# Patient Record
Sex: Female | Born: 1960 | Race: White | Hispanic: No | Marital: Married | Smoking: Never smoker
Health system: Southern US, Community
[De-identification: ages and names within clinical notes are randomized; demographics above are authoritative.]

## PROBLEM LIST (undated history)

## (undated) DIAGNOSIS — I201 Angina pectoris with documented spasm: Secondary | ICD-10-CM

## (undated) DIAGNOSIS — Z95810 Presence of automatic (implantable) cardiac defibrillator: Secondary | ICD-10-CM

## (undated) DIAGNOSIS — Z9889 Other specified postprocedural states: Secondary | ICD-10-CM

## (undated) DIAGNOSIS — K7689 Other specified diseases of liver: Secondary | ICD-10-CM

## (undated) DIAGNOSIS — T82198A Other mechanical complication of other cardiac electronic device, initial encounter: Secondary | ICD-10-CM

## (undated) DIAGNOSIS — I469 Cardiac arrest, cause unspecified: Secondary | ICD-10-CM

## (undated) DIAGNOSIS — I2119 ST elevation (STEMI) myocardial infarction involving other coronary artery of inferior wall: Secondary | ICD-10-CM

## (undated) DIAGNOSIS — R112 Nausea with vomiting, unspecified: Secondary | ICD-10-CM

## (undated) HISTORY — PX: COLONOSCOPY: SHX174

## (undated) HISTORY — DX: Other specified diseases of liver: K76.89

## (undated) HISTORY — DX: Cardiac arrest, cause unspecified: I46.9

## (undated) HISTORY — DX: Other mechanical complication of other cardiac electronic device, initial encounter: T82.198A

## (undated) HISTORY — PX: ENDOMETRIAL ABLATION: SHX621

## (undated) HISTORY — PX: CHOLECYSTECTOMY: SHX55

---

## 1997-06-07 ENCOUNTER — Other Ambulatory Visit: Admission: RE | Admit: 1997-06-07 | Discharge: 1997-06-07 | Payer: Self-pay | Admitting: Gynecology

## 1998-01-29 ENCOUNTER — Inpatient Hospital Stay (HOSPITAL_COMMUNITY): Admission: AD | Admit: 1998-01-29 | Discharge: 1998-01-29 | Payer: Self-pay | Admitting: Obstetrics and Gynecology

## 1998-01-29 ENCOUNTER — Encounter (HOSPITAL_COMMUNITY): Admission: RE | Admit: 1998-01-29 | Discharge: 1998-02-26 | Payer: Self-pay | Admitting: Obstetrics and Gynecology

## 1998-02-25 ENCOUNTER — Inpatient Hospital Stay (HOSPITAL_COMMUNITY): Admission: AD | Admit: 1998-02-25 | Discharge: 1998-03-01 | Payer: Self-pay | Admitting: Obstetrics and Gynecology

## 1998-03-01 ENCOUNTER — Encounter (HOSPITAL_COMMUNITY): Admission: RE | Admit: 1998-03-01 | Discharge: 1998-05-30 | Payer: Self-pay | Admitting: Obstetrics and Gynecology

## 1998-04-11 ENCOUNTER — Other Ambulatory Visit: Admission: RE | Admit: 1998-04-11 | Discharge: 1998-04-11 | Payer: Self-pay | Admitting: Obstetrics and Gynecology

## 1999-10-14 ENCOUNTER — Other Ambulatory Visit: Admission: RE | Admit: 1999-10-14 | Discharge: 1999-10-14 | Payer: Self-pay | Admitting: Obstetrics and Gynecology

## 2000-05-05 ENCOUNTER — Inpatient Hospital Stay (HOSPITAL_COMMUNITY): Admission: AD | Admit: 2000-05-05 | Discharge: 2000-05-08 | Payer: Self-pay | Admitting: Obstetrics and Gynecology

## 2000-05-05 ENCOUNTER — Encounter (INDEPENDENT_AMBULATORY_CARE_PROVIDER_SITE_OTHER): Payer: Self-pay

## 2000-05-09 ENCOUNTER — Encounter: Admission: RE | Admit: 2000-05-09 | Discharge: 2000-06-08 | Payer: Self-pay | Admitting: Obstetrics and Gynecology

## 2000-06-09 ENCOUNTER — Other Ambulatory Visit: Admission: RE | Admit: 2000-06-09 | Discharge: 2000-06-09 | Payer: Self-pay | Admitting: Obstetrics and Gynecology

## 2002-05-18 ENCOUNTER — Other Ambulatory Visit: Admission: RE | Admit: 2002-05-18 | Discharge: 2002-05-18 | Payer: Self-pay | Admitting: Obstetrics and Gynecology

## 2004-02-13 ENCOUNTER — Other Ambulatory Visit: Admission: RE | Admit: 2004-02-13 | Discharge: 2004-02-13 | Payer: Self-pay | Admitting: Obstetrics and Gynecology

## 2007-01-27 ENCOUNTER — Encounter: Admission: RE | Admit: 2007-01-27 | Discharge: 2007-01-27 | Payer: Self-pay | Admitting: Obstetrics and Gynecology

## 2007-08-05 ENCOUNTER — Encounter: Admission: RE | Admit: 2007-08-05 | Discharge: 2007-08-05 | Payer: Self-pay | Admitting: Obstetrics and Gynecology

## 2008-01-09 ENCOUNTER — Encounter: Admission: RE | Admit: 2008-01-09 | Discharge: 2008-01-09 | Payer: Self-pay | Admitting: Obstetrics and Gynecology

## 2008-10-09 ENCOUNTER — Encounter: Admission: RE | Admit: 2008-10-09 | Discharge: 2008-10-09 | Payer: Self-pay | Admitting: Gastroenterology

## 2008-11-06 ENCOUNTER — Encounter: Admission: RE | Admit: 2008-11-06 | Discharge: 2008-11-06 | Payer: Self-pay | Admitting: General Surgery

## 2008-11-07 ENCOUNTER — Ambulatory Visit (HOSPITAL_BASED_OUTPATIENT_CLINIC_OR_DEPARTMENT_OTHER): Admission: RE | Admit: 2008-11-07 | Discharge: 2008-11-07 | Payer: Self-pay | Admitting: General Surgery

## 2009-01-24 ENCOUNTER — Encounter: Admission: RE | Admit: 2009-01-24 | Discharge: 2009-01-24 | Payer: Self-pay | Admitting: Obstetrics and Gynecology

## 2010-01-28 ENCOUNTER — Encounter
Admission: RE | Admit: 2010-01-28 | Discharge: 2010-01-28 | Payer: Self-pay | Source: Home / Self Care | Attending: Obstetrics and Gynecology | Admitting: Obstetrics and Gynecology

## 2010-04-17 LAB — COMPREHENSIVE METABOLIC PANEL
Alkaline Phosphatase: 41 U/L (ref 39–117)
BUN: 14 mg/dL (ref 6–23)
CO2: 28 mEq/L (ref 19–32)
Calcium: 9.5 mg/dL (ref 8.4–10.5)
GFR calc Af Amer: >60 mL/min (ref >60)
GFR calc non Af Amer: >60 mL/min (ref >60)
Glucose, Bld: 66 mg/dL — ABNORMAL LOW (ref 70–99)
Potassium: 3.9 mEq/L (ref 3.5–5.1)
Total Protein: 6.7 g/dL (ref 6.0–8.3)

## 2010-04-17 LAB — CBC
HCT: 40.2 % (ref 36.0–46.0)
Hemoglobin: 13.5 g/dL (ref 12.0–15.0)
Platelets: 188 10*3/uL (ref 150–400)
WBC: 5 10*3/uL (ref 4.0–10.5)

## 2010-04-17 LAB — PROTIME-INR: Prothrombin Time: 12.8 seconds (ref 11.6–15.2)

## 2010-04-17 LAB — DIFFERENTIAL
Basophils Absolute: 0 10*3/uL (ref 0.0–0.1)
Basophils Relative: 0 % (ref 0–1)
Monocytes Relative: 9 % (ref 3–12)
Neutro Abs: 3.5 10*3/uL (ref 1.7–7.7)
Neutrophils Relative %: 71 % (ref 43–77)

## 2010-04-17 LAB — PREGNANCY, URINE: Preg Test, Ur: NEGATIVE

## 2010-04-17 LAB — APTT: aPTT: 29 seconds (ref 24–37)

## 2010-05-30 NOTE — Op Note (Signed)
[facility] [place]  Patient:    Jessica Collier, Jessica Collier                     MRN: [id number] Proc. Date: 05/05/00 Adm. Date:  [id number] Attending:  Trevor Iha                           Operative Report  PREOPERATIVE DIAGNOSIS:       1. Intrauterine pregnancy at 39 weeks.                               2. Previous cesarean section for repeat.                               3. Desires sterility.  POSTOPERATIVE DIAGNOSIS:      1. Intrauterine pregnancy at 39 weeks.                               2. Previous cesarean section for repeat.                               3. Desires sterility.  OPERATION:                    1. Repeat low transverse cesarean section.                               2. Bilateral tubal ligation.  SURGEON:                      Trevor Iha, M.D.  ANESTHESIA:                   Spinal.  ESTIMATED BLOOD LOSS:         800 cc.  INDICATIONS:                  The patient is a 50 year old, G2, P1, at 38 weeks estimated gestational age.  This pregnancy is complicated by advanced maternal age, and she declined amniocentesis.  She desires repeat cesarean section and tubal ligation.  Risks and benefits were discussed at length including 05/998 failure rate of the tubal ligation.  She and her husband gave their informed consent.  See History & Physical for further details.  FINDINGS AT THE TIME OF SURGERY: A viable female infant, Apgars 8/9, pH arterial was 7.33.  The infants weight 7 pounds 14 ounces.  DESCRIPTION OF PROCEDURE:     After adequate analgesia, the patient was placed in the supine position.  She was sterilely prepped and draped.  The bladder was sterilely drained with a Foley catheter.  The previous Pfannenstiel skin incision was was sharply excised in an elliptical incision.  The incision was taken down sharply to the fascia which was incised transversely superiorly and inferiorly out to the bellies of the rectus muscle.  The  rectus muscle was then separated sharply in midline.  Peritoneum was entered sharply.  Bladder blade was placed.  Uterine serosa was elevated and nicked transversely. Bladder flap was created and replaced by the bladder blade.  A low segment myotomy incision was made down to  the infants vertex and extended laterally with operators fingertips.  The infants vertex was then delivered. The nares and pharynx were suctioned.  The infant was delivered.  The cord was clamped, and infant was handed to the pediatricians.  Good cry was noted.  Viable female infant.  Apgars were 8/9.  Cord blood was obtained, placenta extracted manually.  The uterus was exteriorized, wiped clean with a dry lap.  The myotomy incision was closed in two layers, the first being a running locking of 0 Monocryl, the second being an imbricating layer with good a  Good approximation and good hemostasis.  The right and left fallopian tubes were identified, grasped with Babcock clamps.  Avascular windows in mesosalpinx were noted, sutured with 0 plain, was passed through avascular windows, doubly ligated on each fallopian tube. A 2 cm portion of each fallopian tube was sharply excised, handed off as a specimen.  A 0 silk was used to ligate the proximal portion of the tubes, and Bovie cautery was used to cauterize the tubal ostia with good hemostasis achieved.  At this time, the uterus was placed back in the peritoneal cavity and, after copious amounts of irrigation and adequate hemostasis was assured, the peritoneum was closed with 0 Monocryl.  Irrigation was once again applied, and, after adequate hemostasis, the fascia was then closed in a single suture of 0 Panacryl.  Again, irrigation was used, and, after adequate hemostasis was assured, the skin staples and Steri-Strips were applied.  The patient tolerated the procedure well and was stable on transfer to the recovery room. Sponge and instrument count was normal x 3.  The  patient received 1 g of Cefotan after delivery of the placenta. DD:  05/05/00 TD:  05/05/00 Job: [id number]

## 2010-05-30 NOTE — H&P (Signed)
[facility]  Patient:    CAMALA, TALWAR                     MRN: [id number] Adm. Date:  [id number] Attending:  Trevor Iha                         History and Physical  HISTORY OF PRESENT ILLNESS:   Ms. Chaudhary is a 50 year old G2, P1, with previous cesarean section for twins.  The current pregnancy has been uncomplicated, with a single gestation and estimated date of confinement of May 11, 2000.  She presents today for repeat cesarean section.  She also has desired sterility and desires tubal ligation.  Group B strep was negative. The patient did decline amniocentesis for advanced maternal age.  PAST MEDICAL HISTORY:         Negative.  PAST SURGICAL HISTORY:        1. Laparoscopy for endometriosis.                               2. Knee surgery.                               3. Cesarean section.  PHYSICAL EXAMINATION:  VITAL SIGNS:                  Blood pressure 122/70.  HEART:                        Regular rate and rhythm.  LUNGS:                        Clear to auscultation bilaterally.  ABDOMEN:                      Gravid, nontender.  PELVIC:                       Cervix is closed, thick, and high.  IMPRESSION AND PLAN:          Intrauterine pregnancy at 39 weeks.  Previous cesarean section.  Patient desires repeat cesarean section.  She also desires sterility.  The risks and benefits were discussed at length including ______ , risk of infection, bleeding, damage to uterus, tubes, ovaries, bowel, bladder, or fetus.  Also risk of tubal failure as quoted at 5 out of 1000 failure rate.  The patient gives her informed consent. DD:  05/05/00 TD:  05/05/00 Job: [id number]

## 2010-05-30 NOTE — Discharge Summary (Signed)
[facility]  Patient:    Jessica Collier, Jessica Collier                     MRN: [id number] Adm. Date:  [id number] Disc. Date: [id number] Attending:  Trevor Iha Dictator:   Danie Chandler, R.N.                           Discharge Summary  ADMISSION DIAGNOSES:          1. Intrauterine pregnancy at [redacted] weeks gestation.                               2. Previous cesarean section for repeat.                               3. Desires sterility.  DISCHARGE DIAGNOSES:          1. Intrauterine pregnancy at [redacted] weeks gestation.                               2. Previous cesarean section for repeat.                               3. Desires sterility.  PROCEDURES:                   On May 05, 2000, repeat low transverse cesarean section and bilateral tubal ligation.  REASON FOR ADMISSION:         Please see the dictated H&P.  HOSPITAL COURSE:              The patient was taken to the operating room and underwent the above-named procedure without complication.  This was productive of a viable female infant with Apgars of 8 at one minute and 9 at five minutes and an arterial cord pH of 7.33.  Postoperatively on day #1, the patient had a good return of bowel function and good control of pain.  Her hemoglobin on this day was 10.5, hematocrit 30.8, and white blood cell count 8.9.  On postoperative day #2, she was ambulating well without difficulty and tolerating a regular diet.  She was discharged home on postoperative day #3.  CONDITION ON DISCHARGE:       Good.  DIET:                         Regular as tolerated.  ACTIVITY:                     No heavy lifting, no driving, and no vaginal entry.  FOLLOW-UP:                    She is to follow up in the office in one to two weeks for incision check.  SPECIAL INSTRUCTIONS:         She is to call for temperature greater than 100 degrees, persistent nausea, vomiting, heavy vaginal bleeding, and/or drainage from the  incision site.  DISCHARGE MEDICATIONS:        1. Prenatal vitamins one p.o. q.d.  2. Tylox as directed by M.D.                               3. Motrin 600 mg every six hours as needed for                                  pain. DD:  05/26/00 TD:  05/26/00 Job: [id number]

## 2010-09-19 ENCOUNTER — Other Ambulatory Visit: Payer: Self-pay | Admitting: Obstetrics and Gynecology

## 2011-01-09 ENCOUNTER — Other Ambulatory Visit: Payer: Self-pay | Admitting: Obstetrics and Gynecology

## 2011-01-09 DIAGNOSIS — Z1231 Encounter for screening mammogram for malignant neoplasm of breast: Secondary | ICD-10-CM

## 2011-02-03 ENCOUNTER — Ambulatory Visit: Payer: Self-pay

## 2011-02-11 ENCOUNTER — Ambulatory Visit
Admission: RE | Admit: 2011-02-11 | Discharge: 2011-02-11 | Disposition: A | Discharge status: Home / Self Care | Payer: BC Managed Care – PPO | Source: Ambulatory Visit | Attending: Obstetrics and Gynecology | Admitting: Obstetrics and Gynecology

## 2011-02-11 DIAGNOSIS — Z1231 Encounter for screening mammogram for malignant neoplasm of breast: Secondary | ICD-10-CM

## 2011-12-18 ENCOUNTER — Other Ambulatory Visit: Payer: Self-pay | Admitting: Gastroenterology

## 2011-12-18 DIAGNOSIS — R1013 Epigastric pain: Secondary | ICD-10-CM

## 2011-12-31 ENCOUNTER — Other Ambulatory Visit: Payer: BC Managed Care – PPO

## 2012-01-04 ENCOUNTER — Ambulatory Visit
Admission: RE | Admit: 2012-01-04 | Discharge: 2012-01-04 | Disposition: A | Discharge status: Home / Self Care | Payer: BC Managed Care – PPO | Source: Ambulatory Visit | Attending: Gastroenterology | Admitting: Gastroenterology

## 2012-01-04 DIAGNOSIS — R1013 Epigastric pain: Secondary | ICD-10-CM

## 2012-01-15 ENCOUNTER — Other Ambulatory Visit: Payer: Self-pay | Admitting: Obstetrics and Gynecology

## 2012-01-15 DIAGNOSIS — Z1231 Encounter for screening mammogram for malignant neoplasm of breast: Secondary | ICD-10-CM

## 2012-02-16 ENCOUNTER — Ambulatory Visit
Admission: RE | Admit: 2012-02-16 | Discharge: 2012-02-16 | Disposition: A | Discharge status: Home / Self Care | Payer: BC Managed Care – PPO | Source: Ambulatory Visit | Attending: Obstetrics and Gynecology | Admitting: Obstetrics and Gynecology

## 2012-02-16 DIAGNOSIS — Z1231 Encounter for screening mammogram for malignant neoplasm of breast: Secondary | ICD-10-CM

## 2013-03-02 ENCOUNTER — Other Ambulatory Visit: Payer: Self-pay

## 2013-03-02 DIAGNOSIS — Z1231 Encounter for screening mammogram for malignant neoplasm of breast: Secondary | ICD-10-CM

## 2013-03-14 ENCOUNTER — Ambulatory Visit
Admission: RE | Admit: 2013-03-14 | Discharge: 2013-03-14 | Disposition: A | Discharge status: Home / Self Care | Payer: Self-pay | Source: Ambulatory Visit

## 2013-03-14 DIAGNOSIS — Z1231 Encounter for screening mammogram for malignant neoplasm of breast: Secondary | ICD-10-CM

## 2013-06-29 HISTORY — PX: COLONOSCOPY: SHX174

## 2013-06-29 LAB — HM COLONOSCOPY

## 2014-04-13 ENCOUNTER — Other Ambulatory Visit: Payer: Self-pay

## 2014-04-13 DIAGNOSIS — Z1231 Encounter for screening mammogram for malignant neoplasm of breast: Secondary | ICD-10-CM

## 2014-04-23 ENCOUNTER — Ambulatory Visit
Admission: RE | Admit: 2014-04-23 | Discharge: 2014-04-23 | Disposition: A | Discharge status: Home / Self Care | Payer: BC Managed Care – PPO | Source: Ambulatory Visit

## 2014-04-23 DIAGNOSIS — Z1231 Encounter for screening mammogram for malignant neoplasm of breast: Secondary | ICD-10-CM

## 2014-07-31 ENCOUNTER — Inpatient Hospital Stay (HOSPITAL_COMMUNITY)
Admission: EM | Admit: 2014-07-31 | Discharge: 2014-08-04 | DRG: 245 | Disposition: A | Discharge status: Home / Self Care | Payer: BC Managed Care – PPO | Attending: Cardiovascular Disease | Admitting: Cardiovascular Disease

## 2014-07-31 ENCOUNTER — Encounter (HOSPITAL_COMMUNITY)
Admission: EM | Disposition: A | Discharge status: Home / Self Care | Payer: BC Managed Care – PPO | Source: Home / Self Care | Attending: Cardiovascular Disease

## 2014-07-31 ENCOUNTER — Encounter (HOSPITAL_COMMUNITY): Payer: Self-pay | Admitting: *Deleted

## 2014-07-31 ENCOUNTER — Ambulatory Visit (HOSPITAL_COMMUNITY): Admit: 2014-07-31 | Payer: Self-pay | Admitting: Cardiovascular Disease

## 2014-07-31 ENCOUNTER — Emergency Department (HOSPITAL_COMMUNITY): Payer: BC Managed Care – PPO

## 2014-07-31 DIAGNOSIS — K224 Dyskinesia of esophagus: Secondary | ICD-10-CM | POA: Diagnosis present

## 2014-07-31 DIAGNOSIS — I4901 Ventricular fibrillation: Secondary | ICD-10-CM | POA: Diagnosis not present

## 2014-07-31 DIAGNOSIS — Z959 Presence of cardiac and vascular implant and graft, unspecified: Secondary | ICD-10-CM

## 2014-07-31 DIAGNOSIS — I2119 ST elevation (STEMI) myocardial infarction involving other coronary artery of inferior wall: Principal | ICD-10-CM

## 2014-07-31 DIAGNOSIS — R079 Chest pain, unspecified: Secondary | ICD-10-CM | POA: Diagnosis present

## 2014-07-31 DIAGNOSIS — Z8249 Family history of ischemic heart disease and other diseases of the circulatory system: Secondary | ICD-10-CM | POA: Diagnosis not present

## 2014-07-31 DIAGNOSIS — I201 Angina pectoris with documented spasm: Secondary | ICD-10-CM | POA: Diagnosis present

## 2014-07-31 DIAGNOSIS — I462 Cardiac arrest due to underlying cardiac condition: Secondary | ICD-10-CM | POA: Diagnosis present

## 2014-07-31 DIAGNOSIS — I213 ST elevation (STEMI) myocardial infarction of unspecified site: Secondary | ICD-10-CM | POA: Diagnosis not present

## 2014-07-31 DIAGNOSIS — Z9889 Other specified postprocedural states: Secondary | ICD-10-CM

## 2014-07-31 DIAGNOSIS — I739 Peripheral vascular disease, unspecified: Secondary | ICD-10-CM | POA: Diagnosis present

## 2014-07-31 DIAGNOSIS — Z95810 Presence of automatic (implantable) cardiac defibrillator: Secondary | ICD-10-CM

## 2014-07-31 DIAGNOSIS — Z9189 Other specified personal risk factors, not elsewhere classified: Secondary | ICD-10-CM

## 2014-07-31 DIAGNOSIS — I469 Cardiac arrest, cause unspecified: Secondary | ICD-10-CM

## 2014-07-31 HISTORY — DX: Angina pectoris with documented spasm: I20.1

## 2014-07-31 HISTORY — DX: Cardiac arrest, cause unspecified: I46.9

## 2014-07-31 HISTORY — DX: ST elevation (STEMI) myocardial infarction involving other coronary artery of inferior wall: I21.19

## 2014-07-31 HISTORY — DX: Presence of automatic (implantable) cardiac defibrillator: Z95.810

## 2014-07-31 HISTORY — PX: CARDIAC CATHETERIZATION: SHX172

## 2014-07-31 LAB — CBC
HEMATOCRIT: 38.2 % (ref 36.0–46.0)
Hemoglobin: 13.1 g/dL (ref 12.0–15.0)
MCH: 29.2 pg (ref 26.0–34.0)
MCHC: 34.3 g/dL (ref 30.0–36.0)
MCV: 85.1 fL (ref 78.0–100.0)
Platelets: 195 10*3/uL (ref 150–400)
RBC: 4.49 MIL/uL (ref 3.87–5.11)
RDW: 12.3 % (ref 11.5–15.5)
WBC: 5.3 10*3/uL (ref 4.0–10.5)

## 2014-07-31 LAB — PROTIME-INR
INR: 1.07 (ref 0.00–1.49)
Prothrombin Time: 14.1 seconds (ref 11.6–15.2)

## 2014-07-31 LAB — I-STAT TROPONIN, ED: TROPONIN I, POC: 0.01 ng/mL (ref 0.00–0.08)

## 2014-07-31 LAB — BASIC METABOLIC PANEL
Anion gap: 8 (ref 5–15)
BUN: 15 mg/dL (ref 6–20)
CO2: 27 mmol/L (ref 22–32)
Calcium: 9.2 mg/dL (ref 8.9–10.3)
Chloride: 100 mmol/L — ABNORMAL LOW (ref 101–111)
Creatinine, Ser: 0.71 mg/dL (ref 0.44–1.00)
GFR calc Af Amer: >60 mL/min (ref >60)
Glucose, Bld: 113 mg/dL — ABNORMAL HIGH (ref 65–99)
Potassium: 4.1 mmol/L (ref 3.5–5.1)
SODIUM: 135 mmol/L (ref 135–145)

## 2014-07-31 LAB — APTT: APTT: 28 s (ref 24–37)

## 2014-07-31 SURGERY — LEFT HEART CATH AND CORONARY ANGIOGRAPHY
Anesthesia: LOCAL

## 2014-07-31 MED ORDER — NITROGLYCERIN IN D5W 200-5 MCG/ML-% IV SOLN
0.0000 ug/min | INTRAVENOUS | Status: DC
  Administered 2014-08-01: 10 ug/min via INTRAVENOUS
  Filled 2014-07-31: qty 250

## 2014-07-31 MED ORDER — ACETAMINOPHEN 325 MG PO TABS
650.0000 mg | ORAL_TABLET | ORAL | Status: DC | PRN
  Administered 2014-08-01 – 2014-08-04 (×3): 650 mg via ORAL
  Filled 2014-07-31 (×3): qty 2

## 2014-07-31 MED ORDER — ASPIRIN 81 MG PO CHEW: 324.0000 mg | CHEWABLE_TABLET | ORAL | Status: AC

## 2014-07-31 MED ORDER — ONDANSETRON HCL 4 MG/2ML IJ SOLN: 4.0000 mg | Freq: Four times a day (QID) | INTRAMUSCULAR | Status: DC | PRN

## 2014-07-31 MED ORDER — HEPARIN SODIUM (PORCINE) 5000 UNIT/ML IJ SOLN
4000.0000 [IU] | Freq: Once | INTRAMUSCULAR | Status: AC
  Administered 2014-07-31: 4000 [IU] via INTRAVENOUS

## 2014-07-31 MED ORDER — FENTANYL CITRATE (PF) 100 MCG/2ML IJ SOLN
INTRAMUSCULAR | Status: AC
  Filled 2014-07-31: qty 2

## 2014-07-31 MED ORDER — ACETAMINOPHEN 325 MG PO TABS: 650.0000 mg | ORAL_TABLET | ORAL | Status: DC | PRN

## 2014-07-31 MED ORDER — METOPROLOL TARTRATE 12.5 MG HALF TABLET
12.5000 mg | ORAL_TABLET | Freq: Two times a day (BID) | ORAL | Status: DC
  Administered 2014-08-01 (×2): 12.5 mg via ORAL
  Filled 2014-07-31 (×3): qty 1

## 2014-07-31 MED ORDER — FENTANYL CITRATE (PF) 100 MCG/2ML IJ SOLN
INTRAMUSCULAR | Status: DC | PRN
  Administered 2014-07-31: 25 ug via INTRAVENOUS

## 2014-07-31 MED ORDER — ONDANSETRON HCL 4 MG/2ML IJ SOLN
INTRAMUSCULAR | Status: AC
  Administered 2014-07-31: 22:00:00
  Filled 2014-07-31: qty 2

## 2014-07-31 MED ORDER — SODIUM CHLORIDE 0.9 % IJ SOLN: 3.0000 mL | INTRAMUSCULAR | Status: DC | PRN

## 2014-07-31 MED ORDER — HEPARIN SODIUM (PORCINE) 5000 UNIT/ML IJ SOLN: 5000.0000 [IU] | Freq: Three times a day (TID) | INTRAMUSCULAR | Status: DC

## 2014-07-31 MED ORDER — IOHEXOL 350 MG/ML SOLN
INTRAVENOUS | Status: DC | PRN
  Administered 2014-07-31: 60 mL via INTRA_ARTERIAL

## 2014-07-31 MED ORDER — NITROGLYCERIN 0.4 MG SL SUBL: 0.4000 mg | SUBLINGUAL_TABLET | SUBLINGUAL | Status: DC | PRN

## 2014-07-31 MED ORDER — ASPIRIN 300 MG RE SUPP: 300.0000 mg | RECTAL | Status: AC

## 2014-07-31 MED ORDER — HEPARIN (PORCINE) IN NACL 100-0.45 UNIT/ML-% IJ SOLN
850.0000 [IU]/h | INTRAMUSCULAR | Status: DC
  Administered 2014-07-31: 800 [IU]/h via INTRAVENOUS
  Filled 2014-07-31: qty 250

## 2014-07-31 MED ORDER — SODIUM CHLORIDE 0.9 % IV SOLN: INTRAVENOUS | Status: DC

## 2014-07-31 MED ORDER — SODIUM CHLORIDE 0.9 % IV SOLN: 250.0000 mL | INTRAVENOUS | Status: DC | PRN

## 2014-07-31 MED ORDER — ASPIRIN EC 81 MG PO TBEC
81.0000 mg | DELAYED_RELEASE_TABLET | Freq: Every day | ORAL | Status: DC
Start: 2014-08-01 — End: 2014-08-01
  Administered 2014-08-01: 81 mg via ORAL
  Filled 2014-07-31: qty 1

## 2014-07-31 MED ORDER — HEPARIN (PORCINE) IN NACL 2-0.9 UNIT/ML-% IJ SOLN
INTRAMUSCULAR | Status: AC
  Filled 2014-07-31: qty 1500

## 2014-07-31 MED ORDER — ASPIRIN EC 81 MG PO TBEC
81.0000 mg | DELAYED_RELEASE_TABLET | Freq: Every day | ORAL | Status: DC
  Filled 2014-07-31: qty 1

## 2014-07-31 MED ORDER — ONDANSETRON HCL 4 MG/2ML IJ SOLN
INTRAMUSCULAR | Status: AC
  Filled 2014-07-31: qty 2

## 2014-07-31 MED ORDER — ASPIRIN 81 MG PO CHEW
CHEWABLE_TABLET | ORAL | Status: AC
  Administered 2014-07-31: 243 mg
  Filled 2014-07-31: qty 3

## 2014-07-31 MED ORDER — MIDAZOLAM HCL 2 MG/2ML IJ SOLN
INTRAMUSCULAR | Status: DC | PRN
  Administered 2014-07-31: 1 mg via INTRAVENOUS

## 2014-07-31 MED ORDER — ASPIRIN EC 81 MG PO TBEC: 81.0000 mg | DELAYED_RELEASE_TABLET | Freq: Every day | ORAL | Status: DC

## 2014-07-31 MED ORDER — DEXTROSE 5 % IV SOLN
300.0000 mg | INTRAVENOUS | Status: AC | PRN
  Administered 2014-07-31: 300 mg via INTRAVENOUS

## 2014-07-31 MED ORDER — SODIUM CHLORIDE 0.9 % IV SOLN
INTRAVENOUS | Status: DC
  Administered 2014-07-31: via INTRAVENOUS

## 2014-07-31 MED ORDER — HEPARIN SODIUM (PORCINE) 5000 UNIT/ML IJ SOLN
5000.0000 [IU] | Freq: Three times a day (TID) | INTRAMUSCULAR | Status: DC
  Administered 2014-08-01 – 2014-08-04 (×10): 5000 [IU] via SUBCUTANEOUS
  Filled 2014-07-31 (×14): qty 1

## 2014-07-31 MED ORDER — LIDOCAINE HCL (PF) 1 % IJ SOLN
INTRAMUSCULAR | Status: AC
  Filled 2014-07-31: qty 30

## 2014-07-31 MED ORDER — ATORVASTATIN CALCIUM 80 MG PO TABS
80.0000 mg | ORAL_TABLET | Freq: Every day | ORAL | Status: DC
  Administered 2014-08-01 – 2014-08-03 (×3): 80 mg via ORAL
  Filled 2014-07-31 (×4): qty 1

## 2014-07-31 MED ORDER — MIDAZOLAM HCL 2 MG/2ML IJ SOLN
INTRAMUSCULAR | Status: AC
Start: 2014-07-31 — End: 2014-07-31
  Filled 2014-07-31: qty 2

## 2014-07-31 MED ORDER — ONDANSETRON HCL 4 MG/2ML IJ SOLN
INTRAMUSCULAR | Status: DC | PRN
  Administered 2014-07-31: 4 mg via INTRAVENOUS

## 2014-07-31 MED ORDER — SODIUM CHLORIDE 0.9 % IJ SOLN
3.0000 mL | Freq: Two times a day (BID) | INTRAMUSCULAR | Status: DC
  Administered 2014-08-01 (×3): 3 mL via INTRAVENOUS
  Administered 2014-08-02: 6 mL via INTRAVENOUS
  Administered 2014-08-02 – 2014-08-04 (×3): 3 mL via INTRAVENOUS

## 2014-07-31 SURGICAL SUPPLY — 10 items
CATH INFINITI 5FR ANG PIGTAIL (CATHETERS) ×3 IMPLANT
CATH INFINITI 5FR MULTPACK ANG (CATHETERS) ×2 IMPLANT
KIT ENCORE 26 ADVANTAGE (KITS) ×2 IMPLANT
KIT HEART LEFT (KITS) ×3 IMPLANT
PACK CARDIAC CATHETERIZATION (CUSTOM PROCEDURE TRAY) ×3 IMPLANT
SHEATH PINNACLE 6F 10CM (SHEATH) ×2 IMPLANT
SYR MEDRAD MARK V 150ML (SYRINGE) ×3 IMPLANT
TRANSDUCER W/STOPCOCK (MISCELLANEOUS) ×3 IMPLANT
TUBING CIL FLEX 10 FLL-RA (TUBING) ×3 IMPLANT
WIRE EMERALD 3MM-J .035X150CM (WIRE) ×2 IMPLANT

## 2014-07-31 NOTE — Progress Notes (Signed)
Chaplain responded to code stemi. Chaplain introduced herself to pt husband. Chaplain escorted pt husband to cath lab waiting area, informed MD of his location, and provided emotional support and beverages.  Pt husband reported no further needs at this time, but was visably distressed.Pt will have two sisters arriving later this evening. Chaplain reported this to ED registration and requested they be escorted to cath lab waiting as well. Page chaplain as needed.    07/31/14 2200  Clinical Encounter Type  Visited With Family;Health care provider  Visit Type ED;Code;Spiritual support  Referral From Nurse  Spiritual Encounters  Spiritual Needs Emotional  Stress Factors  Family Stress Factors Lack of knowledge;Health changes  Jessica Collier, [name] 07/31/2014 10:32 PM

## 2014-07-31 NOTE — ED Notes (Signed)
Pt sinus rhythm with good pulses.

## 2014-07-31 NOTE — ED Notes (Signed)
Handed EKG to Dr Jeanell Sparrow.

## 2014-07-31 NOTE — Progress Notes (Addendum)
ANTICOAGULATION CONSULT NOTE - Initial Consult  Pharmacy Consult for Heparin Indication: chest pain/ACS  No Known Allergies  Patient Measurements: Height: 5\' 10"  (177.8 cm) Weight: 150 lb (68.04 kg) IBW/kg (Calculated) : 68.5 Heparin Dosing Weight: 68 kg  Vital Signs: Temp: 98 F (36.7 C) (07/19 2121) Temp Source: Oral (07/19 2121) BP: 142/87 mmHg (07/19 2121) Pulse Rate: 71 (07/19 2121)  Labs: No results for input(s): HGB, HCT, PLT, APTT, LABPROT, INR, HEPARINUNFRC, CREATININE, CKTOTAL, CKMB, TROPONINI in the last 72 hours.  CrCl cannot be calculated (Patient has no serum creatinine result on file.).   Medical History: History reviewed. No pertinent past medical history.  Medications:   (Not in a hospital admission) Scheduled:  . aspirin       Infusions:  . amiodarone (NEXTERONE) IV bolus only 150 mg/100 mL 300 mg (07/31/14 2141)    Assessment: 54yo female presents with chest pain. Pharmacy is consulted to dose heparin for ACS/chest pain. Pt had short period of VFib and was shocked and received amio. CBC wnl and Trop neg x1.  Goal of Therapy:  Heparin level 0.3-0.7 units/ml Monitor platelets by anticoagulation protocol: Yes   Plan:  Give 4000 units bolus x 1 Start heparin infusion at 850 units/hr Check anti-Xa level in 6 hours and daily while on heparin Continue to monitor H&H and platelets  Jessica Collier. Jessica Collier, PharmD Clinical Pharmacist Pager [phone] 07/31/2014,9:45 PM

## 2014-07-31 NOTE — Code Documentation (Signed)
Pt awake and talking.   

## 2014-07-31 NOTE — Code Documentation (Signed)
Cardiologist at bedside.  

## 2014-07-31 NOTE — H&P (Signed)
HPI:  54 y/o healthy woman ([employer]) with no past medical history except previous uterine ablation.  Developed sudden onset 10/10 CP this evening while at rest. Pain persisted and came to ER. In ER, ECG with subtle inferior ST elevation (did not meet STEMI criteria). Brought back to ER and developed VF arrest. Had brief CPR with defibrillation x 1 with Carrick. ECG subsequently normalized and CP resolved. Patient then developed nausea and lightheadedness. Treated with ASA 81mg  x 4, heparin, IVF and Zofran. STEMI activated and taken immediately to cath labe   Review of Systems:     Cardiac Review of Systems: {Y] = yes [ ]  = no  Chest Pain [  y  ]  Resting SOB [   ] Exertional SOB  [  ]  Orthopnea [  ]   Pedal Edema [   ]    Palpitations [  ] Syncope  [  ]   Presyncope [   ]  General Review of Systems: [Y] = yes [  ]=no Constitional: recent weight change [  ]; anorexia [  ]; fatigue [  ]; nausea [  ]; night sweats [  ]; fever [  ]; or chills [  ];                                                                     Dental: poor dentition[  ];   Eye : blurred vision [  ]; diplopia [   ]; vision changes [  ];  Amaurosis fugax[  ]; Resp: cough [  ];  wheezing[  ];  hemoptysis[  ]; shortness of breath[  ]; paroxysmal nocturnal dyspnea[  ]; dyspnea on exertion[  ]; or orthopnea[  ];  GI:  gallstones[  ], vomiting[  ];  dysphagia[  ]; melena[  ];  hematochezia [  ]; heartburn[  ];   GU: kidney stones [  ]; hematuria[  ];   dysuria [  ];  nocturia[  ];               Skin: rash [  ], swelling[  ];, hair loss[  ];  peripheral edema[  ];  or itching[  ]; Musculosketetal: myalgias[  ];  joint swelling[  ];  joint erythema[  ];  joint pain[  ];  back pain[  ];  Heme/Lymph: bruising[  ];  bleeding[  ];  anemia[  ];  Neuro: TIA[  ];  headaches[  ];  stroke[  ];  vertigo[  ];  seizures[  ];   paresthesias[  ];  difficulty walking[  ];  Psych:depression[  ]; anxiety[  ];  Endocrine:  diabetes[  ];  thyroid dysfunction[  ];  Other:  History reviewed. No pertinent past medical history.  Prior to Admission medications   Not on File   No meds   No Known Allergies  History   Social History  . Marital Status: Married    Spouse Name: N/A  . Number of Children: N/A  . Years of Education: N/A   Occupational History  . Not on file.   Social History Main Topics  . Smoking status: Never Smoker   . Smokeless tobacco: Never Used  . Alcohol  Use: No  . Drug Use: No  . Sexual Activity: Yes   Other Topics Concern  . Not on file   Social History Narrative  . 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 Non smoker. No drug use.   FAMILY HISTORY:  Mom died from ovarian CA Father with HTN otherwise well No FHX of premature CAD or SCD  PHYSICAL EXAM: Filed Vitals:   07/31/14 2145  BP: 99/65  Pulse: 85  Temp:   Resp: 26   General:  Thin ill-appearing. pale. No respiratory difficulty HEENT: normal Neck: supple. no JVD. Carotids 2+ bilat; no bruits. No lymphadenopathy or thryomegaly appreciated. Cor: PMI nondisplaced. Regular rate & rhythm. No rubs, gallops or murmurs. Lungs: clear Abdomen: soft, nontender, nondistended. No hepatosplenomegaly. No bruits or masses. Good bowel sounds. Extremities: no cyanosis, clubbing, rash, edema Neuro: alert & oriented x 3, cranial nerves grossly intact. moves all 4 extremities w/o difficulty. Affect pleasant.  ECG: SR 69 with subtle inferior ST elevation.  Resolved on f/u ECG.   Results for orders placed or performed during the hospital encounter of 07/31/14 (from the past 24 hour(s))  CBC     Status: None   Collection Time: 07/31/14  9:30 PM  Result Value Ref Range   WBC 5.3 4.0 - 10.5 K/uL   RBC 4.49 3.87 - 5.11 MIL/uL   Hemoglobin 13.1 12.0 - 15.0 g/dL   HCT 38.2 36.0 - 46.0 %   MCV 85.1 78.0 - 100.0 fL   MCH 29.2 26.0 - 34.0 pg   MCHC 34.3 30.0 - 36.0 g/dL   RDW 12.3 11.5 - 15.5 %   Platelets 195 150 - 400 K/uL  I-stat  troponin, ED     Status: None   Collection Time: 07/31/14  9:35 PM  Result Value Ref Range   Troponin i, poc 0.01 0.00 - 0.08 ng/mL   Comment 3           Dg Chest Port 1 View  07/31/2014   CLINICAL DATA:  54 year old female with chest pain  EXAM: PORTABLE CHEST - 1 VIEW  COMPARISON:  None.  FINDINGS: The heart size and mediastinal contours are within normal limits. Both lungs are clear. The visualized skeletal structures are unremarkable. Stimulator wire noted along the thoracic spine.  IMPRESSION: No active disease.   Electronically Signed   By: Anner Crete M.D.   On: 07/31/2014 21:55     ASSESSMENT: 1. Inferior STEMI 2. VF arrest 3. H/o uterine ablation.  PLAN/DISCUSSION:  Presentation worrisome for SCAD in RCA with possible RV involvement. Treat ASA, heparin. BP too low for b-blocker. Emergent cath.   Ahmadou Bolz,MD 10:05 PM

## 2014-07-31 NOTE — ED Notes (Signed)
Pt was in the middle of speaking a sentence, fully CAOx4, warm/pink skin when she suddenly stopped talking and her eyes rolled backwards. Skin color was pale/gray and monitor showed v-fib. I yelled for assistance, checked for pulses (absent) and immediately started compressions.

## 2014-07-31 NOTE — ED Provider Notes (Signed)
CSN: [id number]     Arrival date & time 07/31/14  2112 History   First MD Initiated Contact with Patient 07/31/14 2130     Chief Complaint  Patient presents with  . Chest Pain   L5 caveat secondary to severe illness and need for intervention  (Consider location/radiation/quality/duration/timing/severity/associated sxs/prior Treatment) HPI 54 year old female with chest pain began approximately 45 minutes prior to arrival. She states that she was at rest and she suddenly felt like there was an elephant sitting on her chest. She describes is in the anterior and substernal area. She states that she had an esophageal spasm approximately 5 years ago and does not think that it was like this. She denies any other symptoms such as shortness of breath, radiation, lightheadedness, or diaphoresis. She denies any coronary artery disease risk factors. She states her father had hypertension otherwise normal. History reviewed. No pertinent past medical history. Past Surgical History  Procedure Laterality Date  . Cholecystectomy    . Cesarean section     No family history on file. History  Substance Use Topics  . Smoking status: Never Smoker   . Smokeless tobacco: Never Used  . Alcohol Use: No   OB History    No data available     Review of Systems  All other systems reviewed and are negative.     Allergies  Review of patient's allergies indicates no known allergies.  Home Medications   Prior to Admission medications   Not on File   BP 142/87 mmHg  Pulse 71  Temp(Src) 98 F (36.7 C) (Oral)  Resp 20  Ht 5\' 10"  (1.778 m)  Wt 150 lb (68.04 kg)  BMI 21.52 kg/m2  SpO2 100% Physical Exam  Constitutional: She is oriented to person, place, and time. She appears well-developed and well-nourished.  HENT:  Head: Normocephalic and atraumatic.  Right Ear: External ear normal.  Left Ear: External ear normal.  Nose: Nose normal.  Mouth/Throat: Oropharynx is clear and moist.  Eyes:  Conjunctivae and EOM are normal. Pupils are equal, round, and reactive to light.  Neck: Normal range of motion. Neck supple. No JVD present. No tracheal deviation present. No thyromegaly present.  Cardiovascular: Normal rate, regular rhythm, normal heart sounds and intact distal pulses.   Pulmonary/Chest: Effort normal and breath sounds normal. She has no wheezes.  Abdominal: Soft. Bowel sounds are normal. She exhibits no mass. There is no tenderness. There is no guarding.  Musculoskeletal: Normal range of motion.  Lymphadenopathy:    She has no cervical adenopathy.  Neurological: She is alert and oriented to person, place, and time. She has normal reflexes. No cranial nerve deficit or sensory deficit. Gait normal. GCS eye subscore is 4. GCS verbal subscore is 5. GCS motor subscore is 6.  Reflex Scores:      Bicep reflexes are 2+ on the right side and 2+ on the left side.      Patellar reflexes are 2+ on the right side and 2+ on the left side.  Cranial nerves grossly intact. Patient fluent.   Skin: Skin is warm and dry.  Psychiatric: She has a normal mood and affect. Her behavior is normal. Judgment and thought content normal.  Nursing note and vitals reviewed.   ED Course  Procedures (including critical care time) Labs Review Labs Reviewed  BASIC METABOLIC PANEL  CBC    Imaging Review No results found.   EKG Interpretation   Date/Time:  Tuesday July 31 2014 21:17:16 EDT Ventricular Rate:  66 PR Interval:  170 QRS Duration: 76 QT Interval:  394 QTC Calculation: 422 R Axis:   90 Text Interpretation:  Critical Test Result: STEM AGE AND GENDER  SPECIFIC ECG ANALYSIS  Normal sinus rhythm Rightward axis  ACUTE  MI / STEMI  Consider right ventricular involvement in acute inferior  infarct Abnormal ECG Confirmed by Cardelia Sassano MD, Andee Poles ([id number]) on 07/31/2014  9:24:20 PM      MDM   Final diagnoses:  ST elevation myocardial infarction (STEMI), unspecified artery    EKG  brought to me from triage. Compared to old EKG she does appear to have some ST elevation in 23 and aVF. Patient placed in room 33 and ST elevation MI orders being placed. Patient was paced out as ST elevation MI. Dr. Claiborne Billings responded. Patient became unresponsive and on my return to the room was in ventricular fibrillation and CPR was ensuing. Defibrillated with 100 J and had return of normal sinus rhythm. Amiodarone IV dosed. Heparin dosed. Remainder of aspirin given by mouth. She is awake and alert and speaking. Husband is at bedside and has been informed of what has been done. Patient is going to Cath Lab.  CRITICAL CARE Performed by: Shaune Pollack Total critical care time: 30 Critical care time was exclusive of separately billable procedures and treating other patients. Critical care was necessary to treat or prevent imminent or life-threatening deterioration. Critical care was time spent personally by me on the following activities: development of treatment plan with patient and/or surrogate as well as nursing, discussions with consultants, evaluation of patient's response to treatment, examination of patient, obtaining history from patient or surrogate, ordering and performing treatments and interventions, ordering and review of laboratory studies, ordering and review of radiographic studies, pulse oximetry and re-evaluation of patient's condition.   Pattricia Boss, MD 07/31/14 2155

## 2014-07-31 NOTE — Code Documentation (Signed)
Pt denies pain at this time

## 2014-07-31 NOTE — Code Documentation (Signed)
Heparin 4000 u administered

## 2014-07-31 NOTE — ED Notes (Signed)
zofran administered 

## 2014-07-31 NOTE — ED Notes (Signed)
Pt left ER with Dr. Missy Sabins and this RN.

## 2014-07-31 NOTE — Progress Notes (Signed)
eLink Physician-Brief Progress Note Patient Name: CLAUDE SWENDSEN DOB: 1960/05/29 MRN: [id number]   Date of Service  07/31/2014   HPI/Events of Note  54 yo female with PMH of esophageal spasm. Present with inferior wall STEMI complicated by VFIB arrest. Quick ROSC. Cardiac Cath lab >> clean coronaries.  Question of RCA Spasm. Current medical management includes Lipator, ASA, Lopressor, Heparin IV infusion and Nitroglycerin IV infusion. Management per Cardiology.  eICU Interventions  Continue current management.      Intervention Category Evaluation Type: New Patient Evaluation  Lysle Dingwall 07/31/2014, 11:38 PM

## 2014-07-31 NOTE — ED Notes (Signed)
Patient presents stating she felt like there was an elephant sitting on her chest.  States it isn't as bad as it was at this time.  Took 81mg  ASA

## 2014-08-01 ENCOUNTER — Inpatient Hospital Stay (HOSPITAL_COMMUNITY): Payer: BC Managed Care – PPO

## 2014-08-01 ENCOUNTER — Encounter (HOSPITAL_COMMUNITY): Payer: Self-pay | Admitting: Cardiovascular Disease

## 2014-08-01 DIAGNOSIS — Z9889 Other specified postprocedural states: Secondary | ICD-10-CM

## 2014-08-01 DIAGNOSIS — I201 Angina pectoris with documented spasm: Secondary | ICD-10-CM

## 2014-08-01 DIAGNOSIS — I2119 ST elevation (STEMI) myocardial infarction involving other coronary artery of inferior wall: Principal | ICD-10-CM

## 2014-08-01 DIAGNOSIS — I4901 Ventricular fibrillation: Secondary | ICD-10-CM

## 2014-08-01 HISTORY — DX: Angina pectoris with documented spasm: I20.1

## 2014-08-01 HISTORY — PX: OTHER SURGICAL HISTORY: SHX169

## 2014-08-01 HISTORY — PX: TRANSTHORACIC ECHOCARDIOGRAM: SHX275

## 2014-08-01 LAB — CBC
HEMATOCRIT: 33 % — AB (ref 36.0–46.0)
HEMOGLOBIN: 11.3 g/dL — AB (ref 12.0–15.0)
MCH: 28.8 pg (ref 26.0–34.0)
MCHC: 34.2 g/dL (ref 30.0–36.0)
MCV: 84.2 fL (ref 78.0–100.0)
Platelets: 152 10*3/uL (ref 150–400)
RBC: 3.92 MIL/uL (ref 3.87–5.11)
RDW: 12.4 % (ref 11.5–15.5)
WBC: 9.2 10*3/uL (ref 4.0–10.5)

## 2014-08-01 LAB — LIPID PANEL
CHOL/HDL RATIO: 2.2 ratio
Cholesterol: 159 mg/dL (ref 0–200)
HDL: 71 mg/dL (ref >40)
LDL CALC: 84 mg/dL (ref 0–99)
Triglycerides: 20 mg/dL (ref <150)
VLDL: 4 mg/dL (ref 0–40)

## 2014-08-01 LAB — HEPATIC FUNCTION PANEL
ALBUMIN: 3.2 g/dL — AB (ref 3.5–5.0)
ALK PHOS: 37 U/L — AB (ref 38–126)
ALT: 45 U/L (ref 14–54)
AST: 43 U/L — ABNORMAL HIGH (ref 15–41)
BILIRUBIN DIRECT: 0.2 mg/dL (ref 0.1–0.5)
BILIRUBIN INDIRECT: 0.5 mg/dL (ref 0.3–0.9)
TOTAL PROTEIN: 5.2 g/dL — AB (ref 6.5–8.1)
Total Bilirubin: 0.7 mg/dL (ref 0.3–1.2)

## 2014-08-01 LAB — MAGNESIUM: Magnesium: 1.8 mg/dL (ref 1.7–2.4)

## 2014-08-01 LAB — BASIC METABOLIC PANEL
Anion gap: 7 (ref 5–15)
BUN: 11 mg/dL (ref 6–20)
CO2: 24 mmol/L (ref 22–32)
Calcium: 8.1 mg/dL — ABNORMAL LOW (ref 8.9–10.3)
Chloride: 104 mmol/L (ref 101–111)
Creatinine, Ser: 0.67 mg/dL (ref 0.44–1.00)
GFR calc Af Amer: >60 mL/min (ref >60)
GFR calc non Af Amer: >60 mL/min (ref >60)
GLUCOSE: 128 mg/dL — AB (ref 65–99)
Potassium: 3.6 mmol/L (ref 3.5–5.1)
Sodium: 135 mmol/L (ref 135–145)

## 2014-08-01 LAB — HEPARIN LEVEL (UNFRACTIONATED): Heparin Unfractionated: <0.1 IU/mL — ABNORMAL LOW (ref 0.30–0.70)

## 2014-08-01 LAB — POCT ACTIVATED CLOTTING TIME: Activated Clotting Time: 134 seconds

## 2014-08-01 LAB — TROPONIN I: Troponin I: 0.77 ng/mL (ref <0.031)

## 2014-08-01 LAB — TSH: TSH: 0.449 u[IU]/mL (ref 0.350–4.500)

## 2014-08-01 LAB — MRSA PCR SCREENING: MRSA by PCR: NEGATIVE

## 2014-08-01 MED ORDER — POTASSIUM CHLORIDE CRYS ER 20 MEQ PO TBCR
40.0000 meq | EXTENDED_RELEASE_TABLET | Freq: Once | ORAL | Status: AC
  Administered 2014-08-01: 40 meq via ORAL
  Filled 2014-08-01: qty 2

## 2014-08-01 MED ORDER — PROMETHAZINE HCL 25 MG/ML IJ SOLN
25.0000 mg | Freq: Four times a day (QID) | INTRAMUSCULAR | Status: DC | PRN
  Administered 2014-08-01: 25 mg via INTRAVENOUS
  Filled 2014-08-01: qty 1

## 2014-08-01 MED ORDER — GADOBENATE DIMEGLUMINE 529 MG/ML IV SOLN
25.0000 mL | Freq: Once | INTRAVENOUS | Status: AC | PRN
  Administered 2014-08-01: 25 mL via INTRAVENOUS

## 2014-08-01 MED ORDER — ISOSORBIDE MONONITRATE ER 30 MG PO TB24
30.0000 mg | ORAL_TABLET | Freq: Every day | ORAL | Status: DC
  Administered 2014-08-01 – 2014-08-04 (×3): 30 mg via ORAL
  Filled 2014-08-01 (×4): qty 1

## 2014-08-01 MED ORDER — DILTIAZEM HCL 30 MG PO TABS
30.0000 mg | ORAL_TABLET | Freq: Three times a day (TID) | ORAL | Status: DC
  Administered 2014-08-01 – 2014-08-02 (×3): 30 mg via ORAL
  Filled 2014-08-01 (×6): qty 1

## 2014-08-01 MED ORDER — MAGNESIUM SULFATE 2 GM/50ML IV SOLN
2.0000 g | Freq: Once | INTRAVENOUS | Status: AC
  Administered 2014-08-01: 2 g via INTRAVENOUS
  Filled 2014-08-01: qty 50

## 2014-08-01 MED ORDER — ZOLPIDEM TARTRATE 5 MG PO TABS
5.0000 mg | ORAL_TABLET | Freq: Every evening | ORAL | Status: DC | PRN
  Administered 2014-08-01 – 2014-08-03 (×3): 5 mg via ORAL
  Filled 2014-08-01 (×3): qty 1

## 2014-08-01 MED FILL — Heparin Sodium (Porcine) 2 Unit/ML in Sodium Chloride 0.9%: INTRAMUSCULAR | Qty: 1500 | Status: AC

## 2014-08-01 MED FILL — Lidocaine HCl Local Preservative Free (PF) Inj 1%: INTRAMUSCULAR | Qty: 30 | Status: AC

## 2014-08-01 NOTE — Progress Notes (Signed)
Subjective: No ectopy No pain  Objective: Vital signs in last 24 hours: Temp:  [97.6 F (36.4 C)-98 F (36.7 C)] 98 F (36.7 C) (07/20 0722) Pulse Rate:  [0-101] 58 (07/20 0800) Resp:  [0-56] 19 (07/20 0800) BP: (86-156)/(43-87) 99/51 mmHg (07/20 0800) SpO2:  [94 %-100 %] 99 % (07/20 0800) Weight:  [148 lb 2.4 oz (67.2 kg)-150 lb (68.04 kg)] 148 lb 2.4 oz (67.2 kg) (07/20 0000) Weight change:  Last BM Date: 07/31/14 Intake/Output from previous day: 07/19 0701 - 07/20 0700 In: 892.9 [I.V.:892.9] Out: 550 [Urine:550] Intake/Output this shift: Total I/O In: 206 [I.V.:206] Out: -   Per Dr. Ellyn Hack PE:  General:Pleasant affect, NAD Skin:Warm and dry, brisk capillary refill HEENT:normocephalic, sclera clear, mucus membranes moist Neck:supple, no JVD, no bruits  Heart:S1S2 nl,  RRR without murmur, gallup, rub or click Lungs:clear without rales, rhonchi, or wheezes PTW:SFKC, non tender, + BS, do not palpate liver spleen or masses Ext:no lower ext edema, 2+ pedal pulses, 2+ radial pulses Neuro:alert and oriented, MAE, follows commands, + facial symmetry Tele: SB  EKG ST elevation resolved now with low voltage.  SB at 4   Lab Results:  Recent Labs  07/31/14 2130 08/01/14 0234  WBC 5.3 9.2  HGB 13.1 11.3*  HCT 38.2 33.0*  PLT 195 152   BMET  Recent Labs  07/31/14 2130 08/01/14 0234  NA 135 135  K 4.1 3.6  CL 100* 104  CO2 27 24  GLUCOSE 113* 128*  BUN 15 11  CREATININE 0.71 0.67  CALCIUM 9.2 8.1*   No results for input(s): TROPONINI in the last 72 hours.  Invalid input(s): CK, MB  Lab Results  Component Value Date   CHOL 159 08/01/2014   HDL 71 08/01/2014   LDLCALC 84 08/01/2014   TRIG 20 08/01/2014   CHOLHDL 2.2 08/01/2014   No results found for: HGBA1C   Lab Results  Component Value Date   TSH 0.449 08/01/2014    Hepatic Function Panel No results for input(s): PROT, ALBUMIN, AST, ALT, ALKPHOS, BILITOT, BILIDIR, IBILI in the  last 72 hours.  Recent Labs  08/01/14 0234  CHOL 159   No results for input(s): PROTIME in the last 72 hours.   Studies/Results: Dg Chest Port 1 View  07/31/2014   CLINICAL DATA:  54 year old female with chest pain  EXAM: PORTABLE CHEST - 1 VIEW  COMPARISON:  None.  FINDINGS: The heart size and mediastinal contours are within normal limits. Both lungs are clear. The visualized skeletal structures are unremarkable. Stimulator wire noted along the thoracic spine.  IMPRESSION: No active disease.   Electronically Signed   By: Anner Crete M.D.   On: 07/31/2014 21:55    Medications: I have reviewed the patient's current medications. Scheduled Meds: . aspirin EC  81 mg Oral Daily  . atorvastatin  80 mg Oral q1800  . heparin  5,000 Units Subcutaneous 3 times per day  . metoprolol tartrate  12.5 mg Oral BID  . sodium chloride  3 mL Intravenous Q12H   Continuous Infusions: . sodium chloride    . sodium chloride 100 mL/hr at 08/01/14 0800  . nitroGLYCERIN 10 mcg/min (08/01/14 0800)   PRN Meds:.sodium chloride, acetaminophen, nitroGLYCERIN, ondansetron (ZOFRAN) IV, promethazine, sodium chloride  Bedside eval of Echo: EF looks to be preserved, no obvious WMA.    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 This evening, while on  home she developed severe substernal chest pressure which occurred at rest. She was brought to the emergency room by her husband and her ECG revealed subtle 1 mm ST elevation inferiorly. The patient suddenly developed a VF arrest and was defibrillated 1 with return of normal rhythm.  Cardiac cath with normal coronary arteries.   Normal EF on cath.     Principal Problem:   ST elevation myocardial infarction (STEMI) of inferior wall, due to SPASM- causing V fib.  Awaiting 2 d echo, Mg+ 1.8, TSH 0.449  Active Problems:   VF (ventricular fibrillation)- shock X 1- no further V fib. 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 She'll be treated with aspirin, low-dose nitrates as BP allows, and low-dose beta blocker therapy. will ask EP to see.    Low voltage on EKG no pre admit EKGs to compare.      LOS: 1 day   Time spent with pt. : 15 minutes. [facility]  Nurse Practitioner Certified Pager [phone] or after 5pm and on weekends call [phone] 08/01/2014, 8:52 AM  I have seen, examined and evaluated the patient this AM along with Cecilie Kicks.  After reviewing all the available data and chart,  I agree with her findings, examination as well as impression recommendations.  Unclear etiology of VF - but with her presenting Sx being Crushing SSCP - ischemic etiology cannot be excluded. ? Coronary spasm / Prinzmetal Angina -> will convert from IV NTG to Imdur, continue low dose BB (unlikely to have BP room for CCB).  Statin & ASA.  Agree with EP consultation - sister has h/o SVT.  Very low voltage on EKG -? Cardiac MRI to evaluate for infiltrative process.  If no compelling reason from EP to keep in ICU - will move to Tele this PM.   Ambulate.   Leonie Man, M.D., M.S. Interventional Cardiologist   Pager # [phone]

## 2014-08-01 NOTE — Progress Notes (Signed)
  Echocardiogram 2D Echocardiogram has been performed.  Jennette Dubin 08/01/2014, 9:47 AM

## 2014-08-01 NOTE — Consult Note (Signed)
ELECTROPHYSIOLOGY CONSULT NOTE    Patient ID: Jessica Collier MRN: [id number], DOB/AGE: Jul 04, 1960 54 y.o.  Admit date: 07/31/2014 Date of Consult: 08/01/2014  Primary Physician: No primary care provider on file. Primary Cardiologist: Bensimhon (new this admission)  Reason for Consultation: VF   HPI:  Jessica Collier is a 54 y.o. female with no significant past medical history.  Yesterday, she developed sudden onset chest pain and presented to the ER for further evaluation.  She developed ST elevation while in the ER with subsequent VF that was quickly resuscitated.  Cardiac catheterization demonstrated normal LV function and normal coronaries with the thought being RCA vasospasm.  EP has been asked to evaluate for treatment options.   She has no family history of sudden death.  She has had prior syncope as a child during a gas leak but none since.  She does have occasional palpitations that are short lived and not associated with other symptoms.  She does not have chest pain with exertion, shortness of breath, LE edema, recent fevers, chills, nausea or vomiting.  Echo is pending.     Past Medical History  Diagnosis Date  . S/P cardiac cath, normal coronary arteries 08/01/2014     Surgical History:  Past Surgical History  Procedure Laterality Date  . Cholecystectomy    . Cesarean section    . Cardiac catheterization N/A 07/31/2014    Procedure: Left Heart Cath and Coronary Angiography;  Surgeon: Troy Sine, MD;  Location: [place] [facility];  Service: Cardiovascular;  Laterality: N/A;     No prescriptions prior to admission    Inpatient Medications:  . aspirin EC  81 mg Oral Daily  . atorvastatin  80 mg Oral q1800  . heparin  5,000 Units Subcutaneous 3 times per day  . metoprolol tartrate  12.5 mg Oral BID  . sodium chloride  3 mL Intravenous Q12H    Allergies: No Known Allergies  History   Social History  . Marital Status: Married    Spouse Name: N/A  . Number  of Children: N/A  . Years of Education: N/A   Occupational History  . Not on file.   Social History Main Topics  . Smoking status: Never Smoker   . Smokeless tobacco: Never Used  . Alcohol Use: No  . Drug Use: No  . Sexual Activity: Yes   Other Topics Concern  . Not on file   Social History Narrative     Family History  Problem Relation Age of Onset  . Hypertension Father     Review of Systems: All other systems reviewed and are otherwise negative except as noted above.  Physical Exam: Filed Vitals:   08/01/14 0600 08/01/14 0700 08/01/14 0722 08/01/14 0800  BP: 94/43 98/49  99/51  Pulse: 46 51  58  Temp:   98 F (36.7 C)   TempSrc:   Oral   Resp: 14 18  19   Height:      Weight:      SpO2: 94% 99%  99%    GEN- The patient is well appearing, alert and oriented x 3 today.   HEENT: normocephalic, atraumatic; sclera clear, conjunctiva pink; hearing intact; oropharynx clear; neck supple  Lungs- Clear to ausculation bilaterally, normal work of breathing.  No wheezes, rales, rhonchi Heart- Regular rate and rhythm, no murmurs, rubs or gallops  GI- soft, non-tender, non-distended, bowel sounds present  Extremities- no clubbing, cyanosis, or edema; DP/PT/radial pulses 2+ bilaterally MS- no significant deformity  or atrophy Skin- warm and dry, no rash or lesion Psych- euthymic mood, full affect Neuro- strength and sensation are intact  Labs:   Lab Results  Component Value Date   WBC 9.2 08/01/2014   HGB 11.3* 08/01/2014   HCT 33.0* 08/01/2014   MCV 84.2 08/01/2014   PLT 152 08/01/2014    Recent Labs Lab 08/01/14 0234  NA 135  K 3.6  CL 104  CO2 24  BUN 11  CREATININE 0.67  CALCIUM 8.1*  GLUCOSE 128*      Radiology/Studies: Dg Chest Port 1 View  07/31/2014   CLINICAL DATA:  54 year old female with chest pain  EXAM: PORTABLE CHEST - 1 VIEW  COMPARISON:  None.  FINDINGS: The heart size and mediastinal contours are within normal limits. Both lungs are clear.  The visualized skeletal structures are unremarkable. Stimulator wire noted along the thoracic spine.  IMPRESSION: No active disease.   Electronically Signed   By: Anner Crete M.D.   On: 07/31/2014 21:55    EKG: sinus rhythm, normal intervals, low voltage QRS, T wave inversions V1-V3  TELEMETRY: sinus rhythm  Assessment/Plan: 1.  VF arrest The patient had a resuscitated VF arrest in the setting of ST elevation. Catheterization demonstrated normal coronaries and normal LV function. Echo is pending this morning. Coronary vasospasm is possible trigger.  There is no family history of sudden death.  For now, with vasospasm as a possible cause, would avoid ASA and use CCB instead of BB. With low voltage QRS, will pursue cardiac MRI (discussed with Dr Meda Coffee today). No driving x6 months Keep K >3.9, Mg >1.8  Dr Caryl Comes to see later today.    Signed, Chanetta Marshall, NP 08/01/2014 9:16 AM  The patient had ST segment elevation with subsequent ventricular fibrillation normalization of ST segment elevation in the context of normal coronary arteries. This strongly speaks to mechanism of coronary artery spasm as the critical event responsible then presumably for the chest pain as well as a subsequent electrical event.  Of additional concern but not clearly related other low voltage in her limb leads, her marfanoid habitus, and her PVCs with a right ventricular outflow tract morphology and her history of tachypalpitations. I am not sure how these would be related to ST elevation presumably for spasm. The latter potentially contribute to an explanation for a electrical event that (strips are not available)  In the context of spasm, beta blockers and aspirin are contraindicated. Statins nitrates and calcium blockers are felt to be the standard although a recent paper raises the question about the nitrates.  Cardiac MR is indicated as is signal-averaged ECG looking for evidence of structural heart disease  which might help [place] in decision-making.  A paper published last week in [facility] raises the issues related to spasm and long-term prognosis. In that paper a significant distinction was identified and prognosis related to spasm in the context of or the absence of coronary artery disease. In the latter group, risks were relatively low.

## 2014-08-01 NOTE — Care Management Note (Signed)
Case Management Note  Patient Details  Name: Jessica Collier MRN: [id number] Date of Birth: Jan 16, 1960  Subjective/Objective:        Adm w stemi            Action/Plan: lives w husband   Expected Discharge Date:                  Expected Discharge Plan:     In-House Referral:     Discharge planning Services     Post Acute Care Choice:    Choice offered to:     DME Arranged:    DME Agency:     HH Arranged:    [place] Agency:     Status of Service:     Medicare Important Message Given:    Date Medicare IM Given:    Medicare IM give by:    Date Additional Medicare IM Given:    Additional Medicare Important Message give by:     If discussed at [place] of Stay Meetings, dates discussed:    Additional Comments: ur review done  Lacretia Leigh, RN 08/01/2014, 8:34 AM

## 2014-08-02 DIAGNOSIS — Z9189 Other specified personal risk factors, not elsewhere classified: Secondary | ICD-10-CM

## 2014-08-02 LAB — BASIC METABOLIC PANEL
ANION GAP: 5 (ref 5–15)
BUN: 9 mg/dL (ref 6–20)
CALCIUM: 8.5 mg/dL — AB (ref 8.9–10.3)
CO2: 25 mmol/L (ref 22–32)
CREATININE: 0.67 mg/dL (ref 0.44–1.00)
Chloride: 111 mmol/L (ref 101–111)
GFR calc Af Amer: >60 mL/min (ref >60)
GFR calc non Af Amer: >60 mL/min (ref >60)
GLUCOSE: 95 mg/dL (ref 65–99)
Potassium: 3.8 mmol/L (ref 3.5–5.1)
Sodium: 141 mmol/L (ref 135–145)

## 2014-08-02 LAB — CBC
HCT: 33.1 % — ABNORMAL LOW (ref 36.0–46.0)
Hemoglobin: 11.1 g/dL — ABNORMAL LOW (ref 12.0–15.0)
MCH: 29 pg (ref 26.0–34.0)
MCHC: 33.5 g/dL (ref 30.0–36.0)
MCV: 86.4 fL (ref 78.0–100.0)
PLATELETS: 143 10*3/uL — AB (ref 150–400)
RBC: 3.83 MIL/uL — ABNORMAL LOW (ref 3.87–5.11)
RDW: 12.7 % (ref 11.5–15.5)
WBC: 5.5 10*3/uL (ref 4.0–10.5)

## 2014-08-02 LAB — MAGNESIUM: MAGNESIUM: 2 mg/dL (ref 1.7–2.4)

## 2014-08-02 MED ORDER — DILTIAZEM HCL ER COATED BEADS 120 MG PO CP24
120.0000 mg | ORAL_CAPSULE | Freq: Every day | ORAL | Status: DC
  Administered 2014-08-02 – 2014-08-03 (×2): 120 mg via ORAL
  Filled 2014-08-02 (×3): qty 1

## 2014-08-02 NOTE — Progress Notes (Signed)
Patient Name: Jessica Collier      SUBJECTIVE withpoud compklaint  Past Medical History  Diagnosis Date  . S/P cardiac cath, normal coronary arteries 08/01/2014    Scheduled Meds:  Scheduled Meds: . atorvastatin  80 mg Oral q1800  . diltiazem  30 mg Oral 3 times per day  . heparin  5,000 Units Subcutaneous 3 times per day  . isosorbide mononitrate  30 mg Oral Daily  . sodium chloride  3 mL Intravenous Q12H   Continuous Infusions: . sodium chloride Stopped (08/01/14 1300)  . sodium chloride Stopped (08/01/14 1100)   sodium chloride, acetaminophen, nitroGLYCERIN, ondansetron (ZOFRAN) IV, promethazine, sodium chloride, zolpidem    PHYSICAL EXAM Filed Vitals:   08/02/14 0800 08/02/14 1021 08/02/14 1149 08/02/14 1152  BP: 113/52 113/56 128/67   Pulse:      Temp: 98.2 F (36.8 C)   97.9 F (36.6 C)  TempSrc: Oral   Oral  Resp:    16  Height:      Weight:      SpO2:    100%  Well developed and nourished in no acute distress HENT normal Neck supple with JVP-flat Clear Regular rate and rhythm, no murmurs or gallops Abd-soft with active BS No Clubbing cyanosis edema Skin-warm and dry A & Oriented  Grossly normal sensory and motor function  TELEMETRY: Reviewed telemetry pt in nsr     Intake/Output Summary (Last 24 hours) at 08/02/14 1347 Last data filed at 08/02/14 1100  Gross per 24 hour  Intake    486 ml  Output      0 ml  Net    486 ml    LABS: Basic Metabolic Panel:  Recent Labs Lab 07/31/14 2130 08/01/14 0234 08/02/14 0229  NA 135 135 141  K 4.1 3.6 3.8  CL 100* 104 111  CO2 27 24 25   GLUCOSE 113* 128* 95  BUN 15 11 9   CREATININE 0.71 0.67 0.67  CALCIUM 9.2 8.1* 8.5*  MG  --  1.8 2.0   Cardiac Enzymes:  Recent Labs  08/01/14 0945  TROPONINI 0.77*   CBC:  Recent Labs Lab 07/31/14 2130 08/01/14 0234 08/02/14 0229  WBC 5.3 9.2 5.5  HGB 13.1 11.3* 11.1*  HCT 38.2 33.0* 33.1*  MCV 85.1 84.2 86.4  PLT 195 152 143*    PROTIME:  Recent Labs  07/31/14 2130  LABPROT 14.1  INR 1.07   Liver Function Tests:  Recent Labs  08/01/14 0945  AST 43*  ALT 45  ALKPHOS 37*  BILITOT 0.7  PROT 5.2*  ALBUMIN 3.2*   No results for input(s): LIPASE, AMYLASE in the last 72 hours. BNP: BNP (last 3 results) No results for input(s): BNP in the last 8760 hours.  ProBNP (last 3 results) No results for input(s): PROBNP in the last 8760 hours.  D-Dimer: No results for input(s): DDIMER in the last 72 hours. Hemoglobin A1C: No results for input(s): HGBA1C in the last 72 hours. Fasting Lipid Panel:  Recent Labs  08/01/14 0234  CHOL 159  HDL 71  LDLCALC 84  TRIG 20  CHOLHDL 2.2   Thyroid Function Tests:  Recent Labs  08/01/14 0234  TSH 0.449   Anemia Panel: No results for input(s): VITAMINB12, FOLATE, FERRITIN, TIBC, IRON, RETICCTPCT in the last 72 hours.      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  First and foremost with Dr. Meda Coffee having reviewed the cardiac MRI. The abnormalities on MRI are not acute. This suggests 2 issues. The first implicates recurring spasm with the ultimate creation of a transmural scar. The second suggests that the acute event of spasm followed by ventricular fibrillation may be an interaction between recurring spasm and now a fixed myocardial infarction scar.  This drives a decision in favor of an ICD as it raises issues of risk of spasm from the low risk: Of no coronary disease to the very high risk: Recently identified in the [facility] article of those with coronary disease. Furthermore, due to her codominant arteries, the breadth  of scar suggests that there may be multivessel spasm as well  I have reviewed these issues with  the family for an hour. We have discussed the relative benefits and merits of transvenous versus subcutaneous ICD implantation.  The advantages of the former including battery longevity, history derived from use in randomized controlled trials and perhaps a somewhat lower rate of inappropriate ICD discharges. Advantages of the latter  include the fact that it is extravascular,  Resulting different implications of device infection and it being non-transvalvular.    They would like to pursue subcutaneous ICD and we will arrange to have her mapped today by [facility] therapy will be continued and we can now add nitrates in addition. We will consolidate the diltiazem. We will continue her on heparin  Signed, Virl Axe MD  08/02/2014

## 2014-08-02 NOTE — Progress Notes (Addendum)
Subjective: No ectopy No pain; Has not yet walked/  Objective: Vital signs in last 24 hours: Temp:  [97.9 F (36.6 C)-98.5 F (36.9 C)] 97.9 F (36.6 C) (07/21 1152) Pulse Rate:  [48-76] 76 (07/20 2100) Resp:  [13-23] 16 (07/21 1152) BP: (86-128)/(39-67) 128/67 mmHg (07/21 1149) SpO2:  [95 %-100 %] 100 % (07/21 1152) Weight change:  Last BM Date: 07/31/14 Intake/Output from previous day: 07/20 0701 - 07/21 0700 In: 1764.5 [P.O.:1180; I.V.:534.5; IV Piggyback:50] Out: -  Intake/Output this shift: Total I/O In: 243 [P.O.:240; I.V.:3] Out: -   Per Dr. Ellyn Hack PE:  General:Pleasant affect, NAD Skin:Warm and dry, brisk capillary refill HEENT:normocephalic, sclera clear, mucus membranes moist Neck:supple, no JVD, no bruits  Heart:S1S2 nl,  RRR without murmur, gallup, rub or click Lungs:clear without rales, rhonchi, or wheezes YHC:WCBJ, non tender, + BS, do not palpate liver spleen or masses Ext:no lower ext edema, 2+ pedal pulses, 2+ radial pulses Neuro:alert and oriented, MAE, follows commands, + facial symmetry Tele: SB   Lab Results:  Recent Labs  08/01/14 0234 08/02/14 0229  WBC 9.2 5.5  HGB 11.3* 11.1*  HCT 33.0* 33.1*  PLT 152 143*   BMET  Recent Labs  08/01/14 0234 08/02/14 0229  NA 135 141  K 3.6 3.8  CL 104 111  CO2 24 25  GLUCOSE 128* 95  BUN 11 9  CREATININE 0.67 0.67  CALCIUM 8.1* 8.5*    Recent Labs  08/01/14 0945  TROPONINI 0.77*    Lab Results  Component Value Date   CHOL 159 08/01/2014   HDL 71 08/01/2014   LDLCALC 84 08/01/2014   TRIG 20 08/01/2014   CHOLHDL 2.2 08/01/2014   No results found for: HGBA1C   Lab Results  Component Value Date   TSH 0.449 08/01/2014    Hepatic Function Panel  Recent Labs  08/01/14 0945  PROT 5.2*  ALBUMIN 3.2*  AST 43*  ALT 45  ALKPHOS 37*  BILITOT 0.7  BILIDIR 0.2  IBILI 0.5    Recent Labs  08/01/14 0234  CHOL 159   No results for input(s): PROTIME in the last 72  hours.   Studies/Results: Echo: Study Conclusions  - Left ventricle: The cavity size was normal. Systolic function was normal. The estimated ejection fraction was in the range of 55% to 60%. Wall motion was normal; there were no regional wall motion abnormalities. Left ventricular diastolic function parameters were normal. - Aortic valve: Trileaflet; normal thickness leaflets. There was no regurgitation. - Aortic root: The aortic root was normal in size. - Mitral valve: Structurally normal valve. There was trivial regurgitation. - Left atrium: The atrium was normal in size. - Right ventricle: Systolic function was normal. - Right atrium: The atrium was normal in size. - Tricuspid valve: Structurally normal valve. There was no regurgitation. - Pulmonic valve: There was no regurgitation. - Inferior vena cava: The vessel was normal in size. - Pericardium, extracardiac: There was no pericardial effusion.  Impressions:  - RV poorly visualized. Cardiac MRI recommended to further evaluate.  Dg Chest Port 1 View  07/31/2014   CLINICAL DATA:  54 year old female with chest pain  EXAM: PORTABLE CHEST - 1 VIEW  COMPARISON:  None.  FINDINGS: The heart size and mediastinal contours are within normal limits. Both lungs are clear. The visualized skeletal structures are unremarkable. Stimulator wire noted along the thoracic spine.  IMPRESSION: No active disease.   Electronically Signed   By: Anner Crete M.D.   On:  07/31/2014 21:55   Mr Card Morphology Wo/w Cm  08/01/2014   CLINICAL DATA:  54 year old female post cardiac arrest and no evidence of CAD on cardiac catheterization.  EXAM: CARDIAC MRI  TECHNIQUE: The patient was scanned on a 1.5 Tesla GE magnet. A dedicated cardiac coil was used. Functional imaging was done using Fiesta sequences. 2,3, and 4 chamber views were done to assess for RWMA's. Modified Simpson's rule using a short axis stack was used to calculate an ejection fraction on a  dedicated work Conservation officer, nature. The patient received 25 cc of Multihance. After 10 minutes inversion recovery sequences were used to assess for infiltration and scar tissue.  CONTRAST:  25 cc  of Multihance  FINDINGS: 1.  Normal left ventricular size and systolic function (LVEF = 58%).  There is akinesis of the apical inferior and septal walls as well as mild hypokinesis of the basal inferior and inferolateral walls.  2. Normal right ventricular size with mildly decreased systolic function (RVEF = 41%).  There is dyskinesis of the apical portion of the RV free wall.  3.  Normal bi-atrial size.  4.  Mild tricuspid, trivial aortic and mitral regurgitation.  5. Normal size of the aortic root, thoracic aorta and pulmonary artery.  6. There is transmural late gadolinium enhancement of the mid inferior, apical inferior and septal walls and of the true apex. There is also late gadolinium enhancement in the apical portion of the right ventricle.  IMPRESSION: 1. Normal left ventricular size and systolic function (LVEF = 58%) with akinesis of the apical inferior and septal walls as well as mild hypokinesis of the basal inferior and inferolateral walls.  2. Normal right ventricular size with mildly decreased systolic function (RVEF = 41%) and dyskinetic apical portion of the RV free wall.  3. Transmural late gadolinium enhancement of the mid inferior, apical inferior and septal walls and of the true apex. There is also late gadolinium enhancement in the apical portion of the right ventricle.  Collectively, these finding are consistent with a transmural infarction in the RCA/PDA territory with RV involvement.  Ena Dawley   Electronically Signed   By: Ena Dawley   On: 08/01/2014 20:21    Medications: I have reviewed the patient's current medications. Scheduled Meds: . atorvastatin  80 mg Oral q1800  . diltiazem  30 mg Oral 3 times per day  . heparin  5,000 Units Subcutaneous 3 times per day  .  isosorbide mononitrate  30 mg Oral Daily  . sodium chloride  3 mL Intravenous Q12H   Continuous Infusions: . sodium chloride Stopped (08/01/14 1300)  . sodium chloride Stopped (08/01/14 1100)   PRN Meds:.sodium chloride, acetaminophen, nitroGLYCERIN, ondansetron (ZOFRAN) IV, promethazine, sodium chloride, zolpidem  Bedside eval of Echo: EF looks to be preserved, no obvious WMA.    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 This evening, while on home she developed severe substernal chest pressure which occurred at rest. She was brought to the emergency room by her husband and her ECG revealed subtle 1 mm ST elevation inferiorly. The patient suddenly developed a VF arrest and was defibrillated 1 with return of normal rhythm.   Cardiac cath with normal coronary arteries.   Normal EF on cath.   Unclear etiology of VF - but with her presenting Sx being Crushing SSCP - ischemic etiology cannot be excluded. ? Coronary spasm / Prinzmetal Angina -> will convert from IV NTG to Imdur,  continue low dose BB (unlikely to have BP room for CCB).  Statin & ASA.  Principal Problem:   ST elevation myocardial infarction (STEMI) of inferior wall, due to SPASM Active Problems:   VF (ventricular fibrillation)   Coronary artery spasm   At risk for sudden cardiac death   S/P cardiac cath, normal coronary arteries  Principal Problem:   ST elevation myocardial infarction (STEMI) of inferior wall, due to SPASM- causing V fib.  Awaiting 2 d echo, Mg+ 1.8, TSH 0.449  Cardiac MRI suggests presence of distal RCA infarct which would lend credence to RCA spasm.  Converted BB to CCB + Imdur.  ASA stopped.  Statin started.  Active Problems:   VF (ventricular fibrillation)- shock X 1- no further V fib. -- EP consulted for further consideration / management -- per Dr. Caryl Comes,  Cardiac MRI recommended - see results above. --akinesis of the apical inferior and septal  walls as well as mild hypokinesis of the basal inferior and inferolateral walls.  Normal right ventricular size with mildly decreased systolic function (RVEF = 41%).  There is dyskinesis of the apical portion of the RV free wall.  Will check signal-averaged ECG looking for evidence of structural heart disease    S/P cardiac cath, normal coronary arteries, normal EF by LV Gram & Echo - MRI suggests inferior AK    Coronary artery spasm : started on TID low dose CCB due to low BP & palpitations -- can likley convert to XL dose for d/c.    LOS: 2 days    If no compelling reason from EP to keep in ICU - will move to Tele this PM.   Ambulate.  EP recommends d/c on LifeVest for primary prevention.  Will place order   Leonie Man, M.D., M.S. Interventional Cardiologist   Pager # [phone]

## 2014-08-03 ENCOUNTER — Encounter (HOSPITAL_COMMUNITY)
Admission: EM | Disposition: A | Discharge status: Home / Self Care | Payer: BC Managed Care – PPO | Source: Home / Self Care | Attending: Cardiovascular Disease

## 2014-08-03 ENCOUNTER — Inpatient Hospital Stay (HOSPITAL_COMMUNITY): Payer: BC Managed Care – PPO | Admitting: Certified Registered Nurse Anesthetist

## 2014-08-03 ENCOUNTER — Encounter (HOSPITAL_COMMUNITY): Payer: Self-pay | Admitting: Certified Registered Nurse Anesthetist

## 2014-08-03 DIAGNOSIS — Z9189 Other specified personal risk factors, not elsewhere classified: Secondary | ICD-10-CM

## 2014-08-03 DIAGNOSIS — I2119 ST elevation (STEMI) myocardial infarction involving other coronary artery of inferior wall: Secondary | ICD-10-CM

## 2014-08-03 DIAGNOSIS — I4901 Ventricular fibrillation: Secondary | ICD-10-CM

## 2014-08-03 HISTORY — PX: EP IMPLANTABLE DEVICE: SHX172B

## 2014-08-03 LAB — BASIC METABOLIC PANEL
Anion gap: 8 (ref 5–15)
BUN: 6 mg/dL (ref 6–20)
CALCIUM: 8.8 mg/dL — AB (ref 8.9–10.3)
CO2: 25 mmol/L (ref 22–32)
Chloride: 104 mmol/L (ref 101–111)
Creatinine, Ser: 0.68 mg/dL (ref 0.44–1.00)
Glucose, Bld: 96 mg/dL (ref 65–99)
Potassium: 3.4 mmol/L — ABNORMAL LOW (ref 3.5–5.1)
SODIUM: 137 mmol/L (ref 135–145)

## 2014-08-03 LAB — CBC
HCT: 35.3 % — ABNORMAL LOW (ref 36.0–46.0)
Hemoglobin: 12 g/dL (ref 12.0–15.0)
MCH: 28.9 pg (ref 26.0–34.0)
MCHC: 34 g/dL (ref 30.0–36.0)
MCV: 85.1 fL (ref 78.0–100.0)
PLATELETS: 145 10*3/uL — AB (ref 150–400)
RBC: 4.15 MIL/uL (ref 3.87–5.11)
RDW: 12.5 % (ref 11.5–15.5)
WBC: 5.2 10*3/uL (ref 4.0–10.5)

## 2014-08-03 SURGERY — SUBQ ICD IMPLANT
Anesthesia: General

## 2014-08-03 MED ORDER — LACTATED RINGERS IV SOLN
INTRAVENOUS | Status: DC | PRN
  Administered 2014-08-03: 08:00:00 via INTRAVENOUS

## 2014-08-03 MED ORDER — CEFAZOLIN SODIUM 1-5 GM-% IV SOLN
1.0000 g | Freq: Four times a day (QID) | INTRAVENOUS | Status: AC
  Administered 2014-08-03 – 2014-08-04 (×3): 1 g via INTRAVENOUS
  Filled 2014-08-03 (×3): qty 50

## 2014-08-03 MED ORDER — ONDANSETRON HCL 4 MG/2ML IJ SOLN: 4.0000 mg | Freq: Four times a day (QID) | INTRAMUSCULAR | Status: DC | PRN

## 2014-08-03 MED ORDER — CHLORHEXIDINE GLUCONATE 4 % EX LIQD
60.0000 mL | Freq: Once | CUTANEOUS | Status: AC
  Administered 2014-08-03: 4 via TOPICAL
  Filled 2014-08-03: qty 15

## 2014-08-03 MED ORDER — ONDANSETRON HCL 4 MG/2ML IJ SOLN
INTRAMUSCULAR | Status: DC | PRN
  Administered 2014-08-03: 4 mg via INTRAVENOUS

## 2014-08-03 MED ORDER — OXYCODONE HCL 5 MG PO TABS: 5.0000 mg | ORAL_TABLET | Freq: Once | ORAL | Status: AC | PRN

## 2014-08-03 MED ORDER — PHENYLEPHRINE HCL 10 MG/ML IJ SOLN
INTRAMUSCULAR | Status: DC | PRN
  Administered 2014-08-03: 40 ug via INTRAVENOUS

## 2014-08-03 MED ORDER — ONDANSETRON HCL 4 MG/2ML IJ SOLN: 4.0000 mg | Freq: Once | INTRAMUSCULAR | Status: DC | PRN

## 2014-08-03 MED ORDER — OXYCODONE HCL 5 MG/5ML PO SOLN: 5.0000 mg | Freq: Once | ORAL | Status: AC | PRN

## 2014-08-03 MED ORDER — BUPIVACAINE HCL (PF) 0.25 % IJ SOLN
INTRAMUSCULAR | Status: AC
  Filled 2014-08-03: qty 60

## 2014-08-03 MED ORDER — SODIUM CHLORIDE 0.9 % IV SOLN
INTRAVENOUS | Status: AC
  Administered 2014-08-03: 11:00:00 via INTRAVENOUS

## 2014-08-03 MED ORDER — SODIUM CHLORIDE 0.9 % IR SOLN
Status: AC
  Filled 2014-08-03: qty 2

## 2014-08-03 MED ORDER — FENTANYL CITRATE (PF) 100 MCG/2ML IJ SOLN
INTRAMUSCULAR | Status: DC | PRN
  Administered 2014-08-03: 50 ug via INTRAVENOUS
  Administered 2014-08-03: 150 ug via INTRAVENOUS
  Administered 2014-08-03: 50 ug via INTRAVENOUS

## 2014-08-03 MED ORDER — ACETAMINOPHEN 325 MG PO TABS: 325.0000 mg | ORAL_TABLET | ORAL | Status: DC | PRN

## 2014-08-03 MED ORDER — HEPARIN (PORCINE) IN NACL 2-0.9 UNIT/ML-% IJ SOLN
INTRAMUSCULAR | Status: AC
Start: 2014-08-03 — End: 2014-08-03
  Filled 2014-08-03: qty 500

## 2014-08-03 MED ORDER — LIDOCAINE HCL (CARDIAC) 10 MG/ML IV SOLN
INTRAVENOUS | Status: DC | PRN
  Administered 2014-08-03: 40 mg via INTRAVENOUS

## 2014-08-03 MED ORDER — CEFAZOLIN SODIUM-DEXTROSE 2-3 GM-% IV SOLR
2.0000 g | INTRAVENOUS | Status: AC
  Administered 2014-08-03: 2 g via INTRAVENOUS
  Filled 2014-08-03: qty 50

## 2014-08-03 MED ORDER — BUPIVACAINE HCL (PF) 0.25 % IJ SOLN
INTRAMUSCULAR | Status: DC | PRN
  Administered 2014-08-03: 31 mL

## 2014-08-03 MED ORDER — MIDAZOLAM HCL 5 MG/5ML IJ SOLN
INTRAMUSCULAR | Status: DC | PRN
  Administered 2014-08-03: 2 mg via INTRAVENOUS

## 2014-08-03 MED ORDER — DEXAMETHASONE SODIUM PHOSPHATE 4 MG/ML IJ SOLN
INTRAMUSCULAR | Status: DC | PRN
  Administered 2014-08-03: 4 mg via INTRAVENOUS

## 2014-08-03 MED ORDER — SODIUM CHLORIDE 0.9 % IV SOLN
INTRAVENOUS | Status: DC
  Administered 2014-08-03: 06:00:00 via INTRAVENOUS

## 2014-08-03 MED ORDER — CEFAZOLIN SODIUM-DEXTROSE 2-3 GM-% IV SOLR
INTRAVENOUS | Status: AC
  Filled 2014-08-03: qty 50

## 2014-08-03 MED ORDER — SODIUM CHLORIDE 0.9 % IR SOLN: 80.0000 mg | Status: DC

## 2014-08-03 MED ORDER — HYDROMORPHONE HCL 1 MG/ML IJ SOLN: 0.2500 mg | INTRAMUSCULAR | Status: DC | PRN

## 2014-08-03 MED ORDER — PROPOFOL 10 MG/ML IV BOLUS
INTRAVENOUS | Status: DC | PRN
  Administered 2014-08-03: 200 mg via INTRAVENOUS

## 2014-08-03 SURGICAL SUPPLY — 7 items
CABLE SURGICAL S-101-97-12 (CABLE) ×1 IMPLANT
DILATOR VESSEL 38 20CM 8FR (INTRODUCER) ×1 IMPLANT
HEMOSTAT SURGICEL 2X4 FIBR (HEMOSTASIS) ×1 IMPLANT
ICD SUBQ EMBLEM S-ICD 3401 (Lead) ×1 IMPLANT
ICD SUBQ EMBLEM S-PULSE A209 (ICD Generator) ×1 IMPLANT
PAD DEFIB LIFELINK (PAD) ×1 IMPLANT
TRAY PACEMAKER INSERTION (CUSTOM PROCEDURE TRAY) ×1 IMPLANT

## 2014-08-03 NOTE — Progress Notes (Signed)
Subjective: No ectopy No pain; Has not yet walked/  Objective: Vital signs in last 24 hours: Temp:  [97.9 F (36.6 C)-98.6 F (37 C)] 98.3 F (36.8 C) (07/22 1015) Pulse Rate:  [68-71] 68 (07/22 1030) Resp:  [14-19] 15 (07/22 1000) BP: (105-136)/(56-75) 107/60 mmHg (07/22 1030) SpO2:  [97 %-100 %] 98 % (07/22 1030) Weight change:  Last BM Date: 07/31/14 Intake/Output from previous day: 07/21 0701 - 07/22 0700 In: 248 [P.O.:240; I.V.:8] Out: -  Intake/Output this shift: Total I/O In: 400 [I.V.:400] Out: 100 [Blood:100]  Per Dr. Ellyn Hack PE:  General:Pleasant affect, NAD Skin:Warm and dry, brisk capillary refill HEENT:normocephalic, sclera clear, mucus membranes moist Neck:supple, no JVD, no bruits  Heart:S1S2 nl,  RRR without murmur, gallup, rub or click Lungs:clear without rales, rhonchi, or wheezes PYP:PJKD, non tender, + BS, do not palpate liver spleen or masses Ext:no lower ext edema, 2+ pedal pulses, 2+ radial pulses Neuro:alert and oriented, MAE, follows commands, + facial symmetry Tele: SB   Lab Results:  Recent Labs  08/02/14 0229 08/03/14 0400  WBC 5.5 5.2  HGB 11.1* 12.0  HCT 33.1* 35.3*  PLT 143* 145*   BMET  Recent Labs  08/02/14 0229 08/03/14 0400  NA 141 137  K 3.8 3.4*  CL 111 104  CO2 25 25  GLUCOSE 95 96  BUN 9 6  CREATININE 0.67 0.68  CALCIUM 8.5* 8.8*    Recent Labs  08/01/14 0945  TROPONINI 0.77*    Lab Results  Component Value Date   CHOL 159 08/01/2014   HDL 71 08/01/2014   LDLCALC 84 08/01/2014   TRIG 20 08/01/2014   CHOLHDL 2.2 08/01/2014   No results found for: HGBA1C   Lab Results  Component Value Date   TSH 0.449 08/01/2014    Hepatic Function Panel  Recent Labs  08/01/14 0945  PROT 5.2*  ALBUMIN 3.2*  AST 43*  ALT 45  ALKPHOS 37*  BILITOT 0.7  BILIDIR 0.2  IBILI 0.5    Recent Labs  08/01/14 0234  CHOL 159   No results for input(s): PROTIME in the last 72  hours.   Studies/Results: Echo: Study Conclusions  - Left ventricle: The cavity size was normal. Systolic function was normal. The estimated ejection fraction was in the range of 55% to 60%. Wall motion was normal; there were no regional wall motion abnormalities. Left ventricular diastolic function parameters were normal. - Aortic valve: Trileaflet; normal thickness leaflets. There was no regurgitation. - Aortic root: The aortic root was normal in size. - Mitral valve: Structurally normal valve. There was trivial regurgitation. - Left atrium: The atrium was normal in size. - Right ventricle: Systolic function was normal. - Right atrium: The atrium was normal in size. - Tricuspid valve: Structurally normal valve. There was no regurgitation. - Pulmonic valve: There was no regurgitation. - Inferior vena cava: The vessel was normal in size. - Pericardium, extracardiac: There was no pericardial effusion.  Impressions:  - RV poorly visualized. Cardiac MRI recommended to further evaluate.  Mr Card Morphology Wo/w Cm  08/01/2014   CLINICAL DATA:  54 year old female post cardiac arrest and no evidence of CAD on cardiac catheterization.  EXAM: CARDIAC MRI  TECHNIQUE: The patient was scanned on a 1.5 Tesla GE magnet. A dedicated cardiac coil was used. Functional imaging was done using Fiesta sequences. 2,3, and 4 chamber views were done to assess for RWMA's. Modified Simpson's rule using a short axis stack was used to calculate an ejection  fraction on a dedicated work Conservation officer, nature. The patient received 25 cc of Multihance. After 10 minutes inversion recovery sequences were used to assess for infiltration and scar tissue.  CONTRAST:  25 cc  of Multihance  FINDINGS: 1.  Normal left ventricular size and systolic function (LVEF = 58%).  There is akinesis of the apical inferior and septal walls as well as mild hypokinesis of the basal inferior and inferolateral walls.  2. Normal right  ventricular size with mildly decreased systolic function (RVEF = 41%).  There is dyskinesis of the apical portion of the RV free wall.  3.  Normal bi-atrial size.  4.  Mild tricuspid, trivial aortic and mitral regurgitation.  5. Normal size of the aortic root, thoracic aorta and pulmonary artery.  6. There is transmural late gadolinium enhancement of the mid inferior, apical inferior and septal walls and of the true apex. There is also late gadolinium enhancement in the apical portion of the right ventricle.  IMPRESSION: 1. Normal left ventricular size and systolic function (LVEF = 58%) with akinesis of the apical inferior and septal walls as well as mild hypokinesis of the basal inferior and inferolateral walls.  2. Normal right ventricular size with mildly decreased systolic function (RVEF = 41%) and dyskinetic apical portion of the RV free wall.  3. Transmural late gadolinium enhancement of the mid inferior, apical inferior and septal walls and of the true apex. There is also late gadolinium enhancement in the apical portion of the right ventricle.  Collectively, these finding are consistent with a transmural infarction in the RCA/PDA territory with RV involvement.  Jessica Collier   Electronically Signed   By: Jessica Collier   On: 08/01/2014 20:21    Medications: I have reviewed the patient's current medications. Scheduled Meds: . atorvastatin  80 mg Oral q1800  .  ceFAZolin (ANCEF) IV  1 g Intravenous Q6H  . diltiazem  120 mg Oral QHS  . heparin  5,000 Units Subcutaneous 3 times per day  . isosorbide mononitrate  30 mg Oral Daily  . sodium chloride  3 mL Intravenous Q12H   Continuous Infusions: . sodium chloride Stopped (08/03/14 0756)  . sodium chloride Stopped (08/01/14 1100)  . sodium chloride     PRN Meds:.sodium chloride, acetaminophen, nitroGLYCERIN, ondansetron (ZOFRAN) IV, oxyCODONE **OR** oxyCODONE, promethazine, sodium chloride, zolpidem  Bedside eval of Echo: EF looks to be  preserved, no obvious WMA.    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 This evening, while on home she developed severe substernal chest pressure which occurred at rest. She was brought to the emergency room by her husband and her ECG revealed subtle 1 mm ST elevation inferiorly. The patient suddenly developed a VF arrest and was defibrillated 1 with return of normal rhythm.   Cardiac cath with normal coronary arteries.   Normal EF on cath.   Unclear etiology of VF - but with her presenting Sx being Crushing SSCP - ischemic etiology cannot be excluded. ? Coronary spasm / Prinzmetal Angina -> will convert from IV NTG to Imdur, continue low dose BB (unlikely to have BP room for CCB).  Statin & ASA.  Principal Problem:   ST elevation myocardial infarction (STEMI) of inferior wall, due to SPASM Active Problems:   VF (ventricular fibrillation)   Coronary artery spasm   At risk for sudden cardiac death   S/P cardiac cath, normal coronary arteries   Ventricular fibrillation  Principal Problem:  ST elevation myocardial infarction (STEMI) of inferior wall, due to SPASM- causing V fib.  Awaiting 2 d echo, Mg+ 1.8, TSH 0.449- S/P cardiac cath, normal coronary arteries, normal EF by LV Gram & Echo - MRI suggests inferior AK   Cardiac MRI suggests presence of distal RCA infarct which would lend credence to RCA spasm (? If repetitive injury due to size of scar with low Troponin level.  Converted BB to CCB + Imdur.  ASA stopped.  Statin started.  Active Problems:   VF (ventricular fibrillation)- shock X 1- no further V fib. -- EP consulted for further consideration / management -- per Dr. Caryl Comes,   Cardiac MRI recommended - see results above. - Per discussion with EP - she had Subcutaneous Defibrillator placed today;    Coronary artery spasm : started on TID low dose CCB due to low BP & palpitations -- can likley convert to XL dose for  d/c.    LOS: 3 days    Expected d/c in AM if OK with EP   Jessica Collier, Jessica Collier, M.D., M.S. Interventional Cardiologist   Pager # [phone]

## 2014-08-03 NOTE — Transfer of Care (Signed)
Immediate Anesthesia Transfer of Care Note  Patient: Jessica Collier  Procedure(s) Performed: Procedure(s): SubQ ICD Implant (N/A)  Patient Location: PACU  Anesthesia Type:General  Level of Consciousness: awake, alert , oriented and patient cooperative  Airway & Oxygen Therapy: Patient Spontanous Breathing  Post-op Assessment: Report given to RN, Post -op Vital signs reviewed and stable and Patient moving all extremities  Post vital signs: Reviewed and stable  Last Vitals:  Filed Vitals:   08/03/14 0555  BP: 115/75  Pulse:   Temp: 36.8 C  Resp: 14    Complications: No apparent anesthesia complications

## 2014-08-03 NOTE — Interval H&P Note (Signed)
ICD Criteria  Current LVEF:55%. Within 12 months prior to implant: Yes   Heart failure history: No  Cardiomyopathy history: Yes, Ischemic Cardiomyopathy.  Atrial Fibrillation/Atrial Flutter: No.  Ventricular tachycardia history: Yes, Hemodynamic instability present. 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  Cardiac arrest history: Yes, Ventricular Fibrillation.  History of syndromes with risk of sudden death: Yes, Other  Previous ICD: No.  Current ICD indication: Secondary  PPM indication: No.  Beta Blocker therapy for 3 or more months: No, medical reason.  Ace Inhibitor/ARB therapy for 3 or more months: No, medical reason.  History and Physical Interval Note:  08/03/2014 7:29 AM  Jessica Collier  has presented today for surgery, with the diagnosis of Bradycardia  The various methods of treatment have been discussed with the patient and family. After consideration of risks, benefits and other options for treatment, the patient has consented to  Procedure(s): SubQ ICD Implant (N/A) as a surgical intervention .  The patient's history has been reviewed, patient examined, no change in status, stable for surgery.  I have reviewed the patient's chart and labs.  Questions were answered to the patient's satisfaction.     Virl Axe

## 2014-08-03 NOTE — Anesthesia Preprocedure Evaluation (Addendum)
Anesthesia Evaluation  Patient identified by MRN, date of birth, ID band Patient awake    History of Anesthesia Complications Negative for: history of anesthetic complications  Airway Mallampati: II  TM Distance: >3 FB Neck ROM: Full    Dental  (+) Teeth Intact, Dental Advisory Given   Pulmonary neg pulmonary ROS,  breath sounds clear to auscultation        Cardiovascular + CAD and + Past MI + dysrhythmias Ventricular Fibrillation Rhythm:Regular Rate:Normal     Neuro/Psych negative neurological ROS  negative psych ROS   GI/Hepatic negative GI ROS, Neg liver ROS,   Endo/Other  negative endocrine ROS  Renal/GU negative Renal ROS     Musculoskeletal negative musculoskeletal ROS (+)   Abdominal   Peds  Hematology negative hematology ROS (+)   Anesthesia Other Findings   Reproductive/Obstetrics negative OB ROS                            Anesthesia Physical Anesthesia Plan  ASA: III  Anesthesia Plan: General   Post-op Pain Management:    Induction: Intravenous  Airway Management Planned: LMA  Additional Equipment:   Intra-op Plan:   Post-operative Plan:   Informed Consent: I have reviewed the patients History and Physical, chart, labs and discussed the procedure including the risks, benefits and alternatives for the proposed anesthesia with the patient or authorized representative who has indicated his/her understanding and acceptance.   Dental advisory given  Plan Discussed with: CRNA and Anesthesiologist  Anesthesia Plan Comments:        Anesthesia Quick Evaluation

## 2014-08-03 NOTE — Anesthesia Procedure Notes (Signed)
Procedure Name: LMA Insertion Date/Time: 08/03/2014 7:55 AM Performed by: Rogers Blocker Pre-anesthesia Checklist: Patient identified, Timeout performed, Emergency Drugs available, Suction available and Patient being monitored Patient Re-evaluated:Patient Re-evaluated prior to inductionOxygen Delivery Method: Circle system utilized Preoxygenation: Pre-oxygenation with 100% oxygen Intubation Type: IV induction Ventilation: Mask ventilation without difficulty LMA: LMA inserted LMA Size: 4.0 Number of attempts: 1 Placement Confirmation: positive ETCO2,  CO2 detector and breath sounds checked- equal and bilateral Tube secured with: Tape Dental Injury: Teeth and Oropharynx as per pre-operative assessment

## 2014-08-03 NOTE — Progress Notes (Signed)
Pt to room alert and oriented no complaints of pain at this time. Telemetry monitor applied. Vital signs normal.   ICD site WNL no bleeding or hematoma noted. Family at beside. Will continue to monitor pt. Rosana Fret RN

## 2014-08-03 NOTE — Anesthesia Postprocedure Evaluation (Signed)
  Anesthesia Post-op Note  Patient: Jessica Collier  Procedure(s) Performed: Procedure(s): SubQ ICD Implant (N/A)  Patient Location: PACU  Anesthesia Type:General  Level of Consciousness: awake  Airway and Oxygen Therapy: Patient Spontanous Breathing  Post-op Pain: mild  Post-op Assessment: Post-op Vital signs reviewed              Post-op Vital Signs: Reviewed  Last Vitals:  Filed Vitals:   08/03/14 1353  BP: 101/54  Pulse: 82  Temp: 36.9 C  Resp: 18    Complications: No apparent anesthesia complications

## 2014-08-03 NOTE — Progress Notes (Signed)
Orthopedic Tech Progress Note Patient Details:  Jessica Collier 1960-03-21 [id number]  Ortho Devices Type of Ortho Device: Arm sling Ortho Device/Splint Interventions: Application   Cammer, Angelo Caroll 08/03/2014, 12:28 PM

## 2014-08-03 NOTE — H&P (View-Only) (Signed)
Patient Name: Jessica Collier      SUBJECTIVE withpoud compklaint  Past Medical History  Diagnosis Date  . S/P cardiac cath, normal coronary arteries 08/01/2014    Scheduled Meds:  Scheduled Meds: . atorvastatin  80 mg Oral q1800  . diltiazem  30 mg Oral 3 times per day  . heparin  5,000 Units Subcutaneous 3 times per day  . isosorbide mononitrate  30 mg Oral Daily  . sodium chloride  3 mL Intravenous Q12H   Continuous Infusions: . sodium chloride Stopped (08/01/14 1300)  . sodium chloride Stopped (08/01/14 1100)   sodium chloride, acetaminophen, nitroGLYCERIN, ondansetron (ZOFRAN) IV, promethazine, sodium chloride, zolpidem    PHYSICAL EXAM Filed Vitals:   08/02/14 0800 08/02/14 1021 08/02/14 1149 08/02/14 1152  BP: 113/52 113/56 128/67   Pulse:      Temp: 98.2 F (36.8 C)   97.9 F (36.6 C)  TempSrc: Oral   Oral  Resp:    16  Height:      Weight:      SpO2:    100%  Well developed and nourished in no acute distress HENT normal Neck supple with JVP-flat Clear Regular rate and rhythm, no murmurs or gallops Abd-soft with active BS No Clubbing cyanosis edema Skin-warm and dry A & Oriented  Grossly normal sensory and motor function  TELEMETRY: Reviewed telemetry pt in nsr     Intake/Output Summary (Last 24 hours) at 08/02/14 1347 Last data filed at 08/02/14 1100  Gross per 24 hour  Intake    486 ml  Output      0 ml  Net    486 ml    LABS: Basic Metabolic Panel:  Recent Labs Lab 07/31/14 2130 08/01/14 0234 08/02/14 0229  NA 135 135 141  K 4.1 3.6 3.8  CL 100* 104 111  CO2 27 24 25   GLUCOSE 113* 128* 95  BUN 15 11 9   CREATININE 0.71 0.67 0.67  CALCIUM 9.2 8.1* 8.5*  MG  --  1.8 2.0   Cardiac Enzymes:  Recent Labs  08/01/14 0945  TROPONINI 0.77*   CBC:  Recent Labs Lab 07/31/14 2130 08/01/14 0234 08/02/14 0229  WBC 5.3 9.2 5.5  HGB 13.1 11.3* 11.1*  HCT 38.2 33.0* 33.1*  MCV 85.1 84.2 86.4  PLT 195 152 143*    PROTIME:  Recent Labs  07/31/14 2130  LABPROT 14.1  INR 1.07   Liver Function Tests:  Recent Labs  08/01/14 0945  AST 43*  ALT 45  ALKPHOS 37*  BILITOT 0.7  PROT 5.2*  ALBUMIN 3.2*   No results for input(s): LIPASE, AMYLASE in the last 72 hours. BNP: BNP (last 3 results) No results for input(s): BNP in the last 8760 hours.  ProBNP (last 3 results) No results for input(s): PROBNP in the last 8760 hours.  D-Dimer: No results for input(s): DDIMER in the last 72 hours. Hemoglobin A1C: No results for input(s): HGBA1C in the last 72 hours. Fasting Lipid Panel:  Recent Labs  08/01/14 0234  CHOL 159  HDL 71  LDLCALC 84  TRIG 20  CHOLHDL 2.2   Thyroid Function Tests:  Recent Labs  08/01/14 0234  TSH 0.449   Anemia Panel: No results for input(s): VITAMINB12, FOLATE, FERRITIN, TIBC, IRON, RETICCTPCT in the last 72 hours.      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  First and foremost with Dr. Meda Coffee having reviewed the cardiac MRI. The abnormalities on MRI are not acute. This suggests 2 issues. The first implicates recurring spasm with the ultimate creation of a transmural scar. The second suggests that the acute event of spasm followed by ventricular fibrillation may be an interaction between recurring spasm and now a fixed myocardial infarction scar.  This drives a decision in favor of an ICD as it raises issues of risk of spasm from the low risk: Of no coronary disease to the very high risk: Recently identified in the [facility] article of those with coronary disease. Furthermore, due to her codominant arteries, the breadth  of scar suggests that there may be multivessel spasm as well  I have reviewed these issues with  the family for an hour. We have discussed the relative benefits and merits of transvenous versus subcutaneous ICD implantation.  The advantages of the former including battery longevity, history derived from use in randomized controlled trials and perhaps a somewhat lower rate of inappropriate ICD discharges. Advantages of the latter  include the fact that it is extravascular,  Resulting different implications of device infection and it being non-transvalvular.    They would like to pursue subcutaneous ICD and we will arrange to have her mapped today by [facility] therapy will be continued and we can now add nitrates in addition. We will consolidate the diltiazem. We will continue her on heparin  Signed, Virl Axe MD  08/02/2014

## 2014-08-03 NOTE — Progress Notes (Signed)
notifed orto tech that pt will need arm sling will bring it to pts room 3w18

## 2014-08-04 ENCOUNTER — Encounter (HOSPITAL_COMMUNITY): Payer: Self-pay | Admitting: Cardiology

## 2014-08-04 ENCOUNTER — Inpatient Hospital Stay (HOSPITAL_COMMUNITY): Payer: BC Managed Care – PPO

## 2014-08-04 DIAGNOSIS — Z95810 Presence of automatic (implantable) cardiac defibrillator: Secondary | ICD-10-CM

## 2014-08-04 HISTORY — DX: Presence of automatic (implantable) cardiac defibrillator: Z95.810

## 2014-08-04 LAB — BASIC METABOLIC PANEL
ANION GAP: 9 (ref 5–15)
BUN: 10 mg/dL (ref 6–20)
CO2: 25 mmol/L (ref 22–32)
CREATININE: 0.72 mg/dL (ref 0.44–1.00)
Calcium: 8.7 mg/dL — ABNORMAL LOW (ref 8.9–10.3)
Chloride: 101 mmol/L (ref 101–111)
GFR calc Af Amer: >60 mL/min (ref >60)
GFR calc non Af Amer: >60 mL/min (ref >60)
GLUCOSE: 114 mg/dL — AB (ref 65–99)
Potassium: 3.7 mmol/L (ref 3.5–5.1)
Sodium: 135 mmol/L (ref 135–145)

## 2014-08-04 MED ORDER — ISOSORBIDE MONONITRATE ER 30 MG PO TB24: 30.0000 mg | ORAL_TABLET | Freq: Every day | ORAL | Status: DC

## 2014-08-04 MED ORDER — DILTIAZEM HCL ER COATED BEADS 120 MG PO CP24: 120.0000 mg | ORAL_CAPSULE | Freq: Every day | ORAL | Status: DC

## 2014-08-04 MED ORDER — OXYCODONE-ACETAMINOPHEN 5-325 MG PO TABS
1.0000 | ORAL_TABLET | ORAL | Status: DC | PRN
  Administered 2014-08-04: 1 via ORAL
  Filled 2014-08-04: qty 1

## 2014-08-04 NOTE — Discharge Summary (Signed)
Discharge Summary   Patient ID: Jessica Collier,  MRN: [id number], DOB/AGE: 1960-01-16 54 y.o.  Admit date: 07/31/2014 Discharge date: 08/04/2014  Primary Care Provider: No primary care provider on file. Primary Cardiologist: Dr. 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  Normal coronary arteries.  Normal global LV function with an ejection fraction approximately 55% and evidence for a small area of subtle posterobasal relative hypocontractility. There appears to be angiographic mitral valve prolapse.  Normal coronary arteries without evidence for obstructive disease, spasm or spontaneous dissection.  RECOMMENDATION:  Suspect possible transient RCA vasospasm in the etiology of her transient inferior ST elevation with probable perfusion mediated ventricular arrhythmia. She'll be treated with aspirin, low-dose nitrates as BP allows, and low-dose beta blocker therapy. A 2-D echo Doppler study will be undertaken in the morning. There is no history of any pseudoephedrine, or migraine medication. Consider EP evaluation.    EP PPM/ICD IMPLANT [employer] Emblem S-ICD model A209 serial number [id number] and the device was placed in the pocket, it having been previously copiously irrigated with antibiotic containing saline solution and hemostasis having been accomplished. Surgicel was placed on the inside and outside of the device and the device was secured using both anchoring holes. A 2-0 closure was accomplished.  We then undertook defibrillation threshold testing. A 10 J shock was delivered  synchronously. The measured impedance was 65 ohms. Ventricular fibrillation was then induced via high-voltage pacing. After a total duration of 18.5 seconds, a 65 joule shock was delivered through measured resistance of 62 ohms terminated ventricular fibrillation and restoring sinus rhythm  Echo 07/31/14               ------------------------------------------------------------------- LV EF: 55% -  60%  ------------------------------------------------------------------- Indications:   MI - inferior wall - acute 410.41.  ------------------------------------------------------------------- History:  PMH: No prior cardiac history.  ------------------------------------------------------------------- Study Conclusions  - Left ventricle: The cavity size was normal. Systolic function was normal. The estimated ejection fraction was in the range of 55% to 60%. Wall motion was normal; there were no regional wall motion abnormalities. Left ventricular diastolic function parameters were normal. - Aortic valve: Trileaflet; normal thickness leaflets. There was no regurgitation. - Aortic root: The aortic root was normal in size. - Mitral valve: Structurally normal valve. There was trivial regurgitation. - Left atrium: The atrium was normal in size. - Right ventricle: Systolic function was normal. - Right atrium: The atrium was normal in size. - Tricuspid valve: Structurally normal valve. There was no regurgitation. - Pulmonic valve: There was no regurgitation. - Inferior vena cava: The vessel was normal in size. - Pericardium, extracardiac: There was no pericardial effusion.  Impressions:  - RV poorly visualized. Cardiac MRI recommended to further evaluate.   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 She stated that she was at rest and she suddenly felt like there was an elephant sitting on her chest. She described the pain in the anterior and substernal area. She stated  that she had an esophageal spasm approximately 5 years ago and does not think that it was like this. She denied any other symptoms such as shortness of breath, radiation, lightheadedness, or diaphoresis.   In ER, ECG with subtle inferior ST elevation (did not meet STEMI criteria). Brought back to ER and developed VF arrest. Had brief CPR with defibrillation x 1 with [place]. ECG subsequently normalized and CP resolved. Patient then developed nausea and lightheadedness. Treated with ASA 81mg  x 4, heparin, IVF and Zofran. STEMI activated and taken immediately to cath lab.   Hospital Course  Cath showed normal coronary arteries with normal EF. It was felt that her inferior wall MI was due to coronary spasm/Prinzmental angina with probable perfusion mediated ventricular arrhythmia. TSH 0.449. Mg +1.8. She was treated with ASA,, imdur, and statin. Repeat EKG was low voltage compared to pre admit EKG. Per EP recommendation she was started on low dose CCB instead of BB and recommended Cardiac MR as signal-averaged ECG looking for evidence of structural heart disease which might help in decision-making. In the context of spasm, aspirin is contraindicated, thus stopped. She was started on TID low dose of CCD due to low BP and palpitation. Cardiac MRI showed kinesis of the apical inferior and septal walls as well as mild hypokinesis of the basal inferior and inferolateral walls. Normal right ventricular size with mildly decreased systolic function (RVEF = 41%). There is dyskinesis of the apical portion of the RV free wall. The cardiac MRI reviewed by Dr. Meda Coffee and felt that abnormalities on MRI was not acute. This suggested 2 issues. The first implicates recurring spasm with the ultimate creation of a transmural scar. The second  suggests that the acute event of spasm followed by ventricular fibrillation may be an interaction between recurring spasm and now a fixed myocardial infarction scar.After discussion with family, Dr. Caryl Comes has decided to pursue with subcutaneous ICD due to low risk of device infection, patient age and being non-transvalvular. Patient had a successful placement of [place] lead model 3401 lead serial number [id number]. No further v.fib episode. Patient recovered as expected and felt stable for discharge. She was discharged on long acting nitrates and CCB. The statin was stopped due to no evidence of coronary artery disease. She was given standard instruction of device and wound care. Prior to discharge, device was not interrogated as defibrillator manufacturer representative told that it was not needed as device placed just one day ago.  No driving x6 months Keep K >3.9, Mg >1.8  Discharge Vitals Blood pressure 101/58, pulse 54, temperature 98.2 F (36.8 C), temperature source Oral, resp. rate 20, height 5\' 10"  (1.778 m), weight 148 lb 2.4 oz (67.2 kg), SpO2 97 %.  Filed Weights   07/31/14 2121 08/01/14 0000  Weight: 150 lb (68.04 kg) 148 lb 2.4 oz (67.2 kg)    Labs  CBC  Recent Labs  08/02/14 0229 08/03/14 0400  WBC 5.5 5.2  HGB 11.1* 12.0  HCT 33.1* 35.3*  MCV 86.4 85.1  PLT 143* 027*   Basic Metabolic Panel  Recent Labs  08/02/14 0229 08/03/14 0400 08/04/14 0412  NA 141 137 135  K 3.8 3.4* 3.7  CL 111 104 101  CO2 25 25 25   GLUCOSE 95 96 114*  BUN 9 6 10   CREATININE 0.67 0.68 0.72  CALCIUM 8.5* 8.8* 8.7*  MG 2.0  --   --     Disposition  Pt is being discharged home today in good condition.  Follow-up Plans & Appointments  Follow-up Information    Follow up with [place].   Why:  office will call you with 7-10 post hospital appointment and 1 week wound clinic appoinment   Contact information:   [address] [place] [address] [phone]          Discharge Instructions    Call MD for:  redness, tenderness, or signs of infection (pain, swelling, redness, odor or green/yellow discharge around incision site)    Complete by:  As directed      Diet - low sodium heart healthy    Complete by:  As directed      Discharge instructions    Complete by:  As directed   No lifting over 5 lbs for 1 week. No sexual activity for 1 week. You may return to work after seeing in clinic.     Increase activity slowly    Complete by:  As directed            F/u Labs/Studies: None  Discharge Medications    Medication List    TAKE these medications        diltiazem 120 MG 24 hr capsule  Commonly known as:  CARDIZEM CD  Take 1 capsule (120 mg total) by mouth at bedtime.     isosorbide mononitrate 30 MG 24 hr tablet  Commonly known as:  IMDUR  Take 1 tablet (30 mg total) by mouth daily.        Duration of Discharge Encounter   Greater than 30 minutes including physician time.  Signed, Bhagat,Bhavinkumar PA-C 08/04/2014, 12:55 PM  Agree with above.  Patient seen earlier and stable for discharge  W. Doristine Church. MD [facility] 08/04/2014

## 2014-08-04 NOTE — Discharge Instructions (Signed)
° ° °  Supplemental Discharge Instructions for  Pacemaker/Defibrillator Patients  NO DRIVING for 6 months.   WOUND CARE  - Keep the wound area clean and dry.  Do not get this area wet for one week. No showers for one week; you may shower on 7/30. - The tape/steri-strips on your wound will fall off; do not pull them off.  No bandage is needed on the site.  DO  NOT apply any creams, oils, or ointments to the wound area. - If you notice any drainage or discharge from the wound, any swelling or bruising at the site, or you develop a fever > 101? F after you are discharged home, call the office at once.

## 2014-08-04 NOTE — Progress Notes (Signed)
Pt to Radiology  via w/c for CXR to evaluate ICD. Pt in stable condition. VSS. Pt on monitor.

## 2014-08-04 NOTE — Progress Notes (Signed)
Subjective:  She feels well today and is anxious to go home.  No recurrent chest pain.  Objective:  Vital Signs in the last 24 hours: BP 107/63 mmHg  Pulse 58  Temp(Src) 98.5 F (36.9 C) (Oral)  Resp 20  Ht 5\' 10"  (1.778 m)  Wt 67.2 kg (148 lb 2.4 oz)  BMI 21.26 kg/m2  SpO2 99%  Physical Exam: Pleasant female in no acute distress Lungs:  Clear Cardiac:  Regular rhythm, normal S1 and S2, no S3 Chest wall: ICD implant is clean and dry without swelling Extremities:  No edema present  Intake/Output from previous day: 07/22 0701 - 07/23 0700 In: 893 [P.O.:440; I.V.:403; IV Piggyback:50] Out: 100 [Blood:100]  Weight Filed Weights   07/31/14 2121 08/01/14 0000  Weight: 68.04 kg (150 lb) 67.2 kg (148 lb 2.4 oz)    Lab Results: Basic Metabolic Panel:  Recent Labs  08/03/14 0400 08/04/14 0412  NA 137 135  K 3.4* 3.7  CL 104 101  CO2 25 25  GLUCOSE 96 114*  BUN 6 10  CREATININE 0.68 0.72   CBC:  Recent Labs  08/02/14 0229 08/03/14 0400  WBC 5.5 5.2  HGB 11.1* 12.0  HCT 33.1* 35.3*  MCV 86.4 85.1  PLT 143* 145*   Telemetry: Sinus rhythm  Assessment/Plan:  1.  Recent inferior infarction due to coronary spasm with normal coronary arteries 2.  VF occurring in the setting of acute infarction now with subcutaneous ICD  Recommendations:  Okay for discharge today.  Discussed with electrophysiology.  Defibrillator manufacturer to be contacted prior to discharge to see if the need to see patient today.  Discharged on long-acting nitrates and long-acting calcium channel blockers.  Since ejection fraction is normal in event was spasm induced no beta blocker.  No evidence of coronary artery disease so we'll not treat with statins at this time.  Needs follow-up in one week.      Kerry Hough  MD [facility] Card[facility]  08/04/2014, 10:51 AM

## 2014-08-04 NOTE — Progress Notes (Signed)
Pt discharged home with spouse.  Reviewed discharge instruction and education, all questions answered.  Assessment unchanged from earlier.

## 2014-08-06 MED FILL — Heparin Sodium (Porcine) 2 Unit/ML in Sodium Chloride 0.9%: INTRAMUSCULAR | Qty: 500 | Status: AC

## 2014-08-06 MED FILL — Bupivacaine HCl Preservative Free (PF) Inj 0.25%: INTRAMUSCULAR | Qty: 30 | Status: AC

## 2014-08-07 ENCOUNTER — Telehealth: Payer: Self-pay | Admitting: Cardiovascular Disease

## 2014-08-07 ENCOUNTER — Telehealth: Payer: Self-pay | Admitting: Internal Medicine

## 2014-08-07 NOTE — Telephone Encounter (Signed)
New Message  Pt calling to see if, until [place] on 8/2, she needs to wear a sling the entire time. Please call back and discuss.

## 2014-08-07 NOTE — Telephone Encounter (Signed)
Patient contacted regarding discharge from [facility] on 08/04/2014.  Patient understands to follow up with provider Richardson Dopp on 08/14/2014 at 9:00am at [facility]. Patient understands discharge instructions? yes Patient understands medications and regiment? yes  Patient understands to bring all medications to this visit? yes   Patient states she is doing ok and is waiting for a nurse to call her back to discuss questions about her arm in the sling.

## 2014-08-07 NOTE — Telephone Encounter (Signed)
Advised that she may leave sling off.  She verbalized understanding.

## 2014-08-07 NOTE — Telephone Encounter (Signed)
TCM phone call .  Appt on 08/14/14 at [place] w/ Ignacia Bayley   Thanks

## 2014-08-09 ENCOUNTER — Telehealth: Payer: Self-pay | Admitting: *Deleted

## 2014-08-09 NOTE — Telephone Encounter (Signed)
Called patient to reschedule wound check appointment and TCM appointment from 8/2 to 8/5.  Patient agreeable to rescheduling appointments.  Instructed patient to keep bandages on implant site until appointment.  Patient asked if slight, intermittent burning at incision site with movement is normal; I advised that this is a normal part of the healing process.  Patient denies any signs or symptoms of infection.  Patient states that her right arm is sore when she moves it in certain directions, states that it feels muscular, and denies experiencing any chest pain or cardiac symptoms.  Patient states that the intermittent soreness is limited to her upper right arm and asked if this could be from overuse due to not having full ROM with left arm; I advised that muscular soreness in right arm could be from overuse.  Patient additionally states she has a "knot about 1"-2" above where my IV was in my left arm".  States it is tender to touch but denies swelling, redness, constant pain, or discharge from site.  Advised patient that the knot should continue to dissipate over the next few days but to continue to monitor site for signs and symptoms of infection.  Advised patient to call with any worsening symptoms, questions, or concerns.  Patient voiced understanding of all instructions.

## 2014-08-10 ENCOUNTER — Telehealth: Payer: Self-pay | Admitting: Physician Assistant

## 2014-08-10 NOTE — Telephone Encounter (Signed)
Informed patient that she can remove the tegaderm/gauze dressing. Patient voiced understanding.

## 2014-08-10 NOTE — Telephone Encounter (Signed)
New message     Pt states had defib implanted by Dr Caryl Comes  Pt wants to know when or if she can take bandage off Pt asks if you do not reach her on primary number to please call this [phone] Please call to discuss

## 2014-08-13 ENCOUNTER — Ambulatory Visit: Payer: BC Managed Care – PPO

## 2014-08-14 ENCOUNTER — Ambulatory Visit: Payer: BC Managed Care – PPO

## 2014-08-14 ENCOUNTER — Encounter: Payer: BC Managed Care – PPO | Admitting: Physician Assistant

## 2014-08-16 NOTE — Addendum Note (Signed)
Addendum  created 08/16/14 2229 by Roberts Gaudy, MD   Modules edited: Anesthesia Responsible Staff

## 2014-08-16 NOTE — Progress Notes (Signed)
Cardiology Office Note   Date:  08/17/2014   ID:  Jessica Collier, DOB February 02, 1960, MRN [id number]  PCP:  Luz Lex, MD  Cardiologist:  Dr. Glenetta Hew   Electrophysiologist:  Dr. Virl Axe   Chief Complaint  Patient presents with  . Hospitalization Follow-up    STEMI, VF arrest, s/p ICD  . Coronary Vasospasm     History of Present Illness: Jessica Collier is a 54 y.o. female who was admitted 7/19-7/23 with VF arrest in the setting of inferior STEMI.  She underwent CPR and defib x 1 with ROSC.  Emergent LHC demonstrated normal coronary arteries and preserved LVF.  It was felt that her MI was due to coronary vasospasm and probable perfusion mediated ventricular arrhythmia.  She was treated with nitrates and low dose of CCB (no BB due to spasm).  She was seen by EP and Cardiac MRI showed akinesis of the apical inferior and septal walls as well as mild hypokinesis of the basal inferior and inferolateral walls. Normal right ventricular size with mildly decreased systolic function (RVEF = 41%). There was dyskinesis of the apical portion of the RV free wall. The cardiac MRI was reviewed by Dr. Meda Coffee and it was felt that abnormalities on MRI were not acute. This suggested recurring spasm with the ultimate creation of a transmural scar and an acute event of spasm followed by ventricular fibrillation indicating a likely interaction between recurring spasm and now a fixed myocardial infarction scar. Dr. Caryl Comes recommended implantation of a subcutaneous ICD.  She had successful placement of [employer] SQ lead model 3401 lead serial number [id number].   She returns for FU.  Here with her sister.  The patient denies chest pain, shortness of breath, syncope, orthopnea, PND or significant pedal edema.  Chest is still sore from CPR.    Studies/Reports Reviewed Today:  Echo 08/01/14 EF 55-60%, normal wall motion, normal diastolic function, trivial MR   LHC 07/31/14  The left  ventricular systolic function is normal.  Normal coronary arteries. Normal global LV function with an ejection fraction approximately 55% and evidence for a small area of subtle posterobasal relative hypocontractility. There appears to be angiographic mitral valve prolapse. Normal coronary arteries without evidence for obstructive disease, spasm or spontaneous dissection. RECOMMENDATION: Suspect possible transient RCA vasospasm in the etiology of her transient inferior ST elevation with probable perfusion mediated ventricular arrhythmia. She'll be treated with aspirin, low-dose nitrates as BP allows, and low-dose beta blocker therapy. A 2-D echo Doppler study will be undertaken in the morning. There is no history of any pseudoephedrine, or migraine medication. Consider EP evaluation.  Cardiac MRI 08/01/14 IMPRESSION: 1. Normal left ventricular size and systolic function (LVEF = 58%) with akinesis of the apical inferior and septal walls as well as mild hypokinesis of the basal inferior and inferolateral walls. 2. Normal right ventricular size with mildly decreased systolic function (RVEF = 41%) and dyskinetic apical portion of the RV free wall. 3. Transmural late gadolinium enhancement of the mid inferior, apical inferior and septal walls and of the true apex. There is also late gadolinium enhancement in the apical portion of the right ventricle. Collectively, these finding are consistent with a transmural infarction in the RCA/PDA territory with RV involvement.    Past Medical History  Diagnosis Date  . S/P cardiac cath, normal coronary arteries 08/01/2014  . AICD (automatic cardioverter/defibrillator) present 08/04/2014    [facility] scientific device implanted subcutaneously because of ventricular fibrillation occurring during the setting of  coronary spasm   . ST elevation myocardial infarction (STEMI) of inferior wall, due to SPASM 07/31/2014    July/2016 thought due to coronary  spasm-normal coronary arteries noted.  Cardiac MRI confirms area of infarction in the territory of the right coronary artery.   . 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 Cholecystectomy    . Cesarean section    . Cardiac catheterization N/A 07/31/2014    Procedure: Left Heart Cath and Coronary Angiography;  Surgeon: Troy Sine, MD;  Location: [place] [facility];  Service: Cardiovascular;  Laterality: N/A;  . Ep implantable device N/A 08/03/2014    Procedure: SubQ ICD Implant;  Surgeon: Deboraha Sprang, MD;  Location: [place] [facility];  Service: Cardiovascular;  Laterality: N/A;     Current Outpatient Prescriptions  Medication Sig Dispense Refill  . diltiazem (CARDIZEM CD) 120 MG 24 hr capsule Take 1 capsule (120 mg total) by mouth at bedtime. 30 capsule 6  . isosorbide mononitrate (IMDUR) 30 MG 24 hr tablet Take 1 tablet (30 mg total) by mouth daily. 30 tablet 6   No current facility-administered medications for this visit.    Allergies:   Review of patient's allergies indicates no known allergies.    Social History:  The patient  reports that she has never smoked. She has never used smokeless tobacco. She reports that she does not drink alcohol or use illicit drugs.   Family History:  The patient's family history includes Hypertension in her father.    ROS:   Please see the history of present illness.   Review of Systems  All other systems reviewed and are negative.    PHYSICAL EXAM: VS:  BP 130/70 mmHg  Pulse 77  Ht 5\' 10"  (1.778 m)  Wt 141 lb 12.8 oz (64.32 kg)  BMI 20.35 kg/m2    Wt Readings from Last 3 Encounters:  08/17/14 141 lb 12.8 oz (64.32 kg)  08/01/14 148 lb 2.4 oz (67.2 kg)     GEN: Well nourished, well developed, in no acute distress HEENT: normal Neck: no JVD, no carotid bruits, no masses Cardiac:  Normal S1/S2, RRR; no murmur ,  no rubs or gallops, no edema; R groin without hematoma or bruit    Respiratory:  clear to auscultation bilaterally, no wheezing, rhonchi or rales. GI: soft, nontender, nondistended, + BS MS: no deformity or atrophy Skin: warm and dry  Neuro:  CNs II-XII intact, Strength and sensation are intact Psych: Normal affect   EKG:  EKG is ordered today.  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  We discussed the importance avoiding stimulants.  Continue nitrates, calcium channel blocker. She declined cardiac rehab. She will continue to increase activity on her own.  AICD (automatic cardioverter/defibrillator) present:  Rep from [employer] saw patient today and answered  all her questions regarding SCICD.  Wound is checked today.  FU with EP   Insomnia:  She has had significant issues staying asleep since DC.  She has had a little success with Ambien.  I will give her a 1 time Rx for Ambien CR 6.25 mg QHS prn for sleep, #30, no refills. She can have this further refilled by PCP.    Medication Changes: Current medicines are reviewed at length with the patient today.  Concerns regarding medicines are as outlined above.  The following changes have been made:   Discontinued Medications   No medications on file   Modified Medications   No medications on file   New Prescriptions   No medications on file    Labs/ tests ordered today include:   No orders of the defined types were  placed in this encounter.     Disposition:   FU with Dr. Virl Axe 3 mos and Dr. Glenetta Hew 6-8 weeks.     Signed, Versie Starks, MHS 08/17/2014 1:10 PM    [facility] [address], [place], [place]  [address] Phone: [phone]; Fax: [phone]

## 2014-08-17 ENCOUNTER — Encounter: Payer: Self-pay | Admitting: Physician Assistant

## 2014-08-17 ENCOUNTER — Ambulatory Visit (INDEPENDENT_AMBULATORY_CARE_PROVIDER_SITE_OTHER): Payer: BC Managed Care – PPO | Admitting: Physician Assistant

## 2014-08-17 ENCOUNTER — Ambulatory Visit (INDEPENDENT_AMBULATORY_CARE_PROVIDER_SITE_OTHER): Payer: BC Managed Care – PPO | Admitting: *Deleted

## 2014-08-17 VITALS — BP 130/70 | HR 77 | Ht 70.0 in | Wt 141.8 lb

## 2014-08-17 DIAGNOSIS — Z95810 Presence of automatic (implantable) cardiac defibrillator: Secondary | ICD-10-CM

## 2014-08-17 DIAGNOSIS — G4700 Insomnia, unspecified: Secondary | ICD-10-CM

## 2014-08-17 DIAGNOSIS — I201 Angina pectoris with documented spasm: Secondary | ICD-10-CM

## 2014-08-17 DIAGNOSIS — I4901 Ventricular fibrillation: Secondary | ICD-10-CM

## 2014-08-17 LAB — CUP PACEART INCLINIC DEVICE CHECK
Battery Remaining Percentage: 99 %
Date Time Interrogation Session: 20160805183243
MDC IDC PG SERIAL: 112653

## 2014-08-17 NOTE — Patient Instructions (Signed)
Medication Instructions:  Same-no change  Labwork: none  Testing/Procedures: none  Follow-Up: Your physician recommends that you schedule a follow-up appointment in: 6 to 8 weeks with Dr Ellyn Hack and 3 months with Dr Caryl Comes.

## 2014-08-17 NOTE — Progress Notes (Signed)
Wound check s/p subcutaneous ICD implant. Wound well healed without redness or edema. Incision edges approximated. Normal device function (Industry checked). 0 untreated episodes; 0 treated episodes; 0 shocks delivered. Electrode impedance status okay. No programming changes. Remaining longevity to ERI 99%. Plan to follow up with [facility] in 3 months.

## 2014-08-27 ENCOUNTER — Telehealth: Payer: Self-pay

## 2014-08-27 NOTE — Telephone Encounter (Signed)
Prior auth for Zolpidem CR 6.25mg  sent to [facility] via [employer].

## 2014-08-29 ENCOUNTER — Other Ambulatory Visit: Payer: Self-pay | Admitting: Obstetrics and Gynecology

## 2014-08-30 ENCOUNTER — Telehealth: Payer: Self-pay

## 2014-08-30 LAB — CYTOLOGY - PAP

## 2014-08-30 NOTE — Telephone Encounter (Signed)
Approval received from [facility] for Zolpidem ER 6.25mg , add. quantity above the limit. Good through 08/27/2015. Pharmacy notified.

## 2014-10-02 ENCOUNTER — Encounter: Payer: Self-pay | Admitting: Internal Medicine

## 2014-10-08 ENCOUNTER — Ambulatory Visit (INDEPENDENT_AMBULATORY_CARE_PROVIDER_SITE_OTHER): Payer: BC Managed Care – PPO | Admitting: Cardiology

## 2014-10-08 VITALS — BP 116/70 | HR 82 | Ht 70.0 in | Wt 144.7 lb

## 2014-10-08 DIAGNOSIS — I4901 Ventricular fibrillation: Secondary | ICD-10-CM | POA: Diagnosis not present

## 2014-10-08 DIAGNOSIS — I2119 ST elevation (STEMI) myocardial infarction involving other coronary artery of inferior wall: Secondary | ICD-10-CM | POA: Diagnosis not present

## 2014-10-08 DIAGNOSIS — I201 Angina pectoris with documented spasm: Secondary | ICD-10-CM

## 2014-10-08 MED ORDER — NITROGLYCERIN 0.4 MG SL SUBL: 0.4000 mg | SUBLINGUAL_TABLET | SUBLINGUAL | Status: DC | PRN

## 2014-10-08 NOTE — Patient Instructions (Signed)
No changes in current medications  Ask Dr Caryl Comes when you can return to driving.   Your physician wants you to follow-up in 6-8 months with Dr Ellyn Hack  30 min.  You will receive a reminder letter in the mail two months in advance. If you don't receive a letter, please call our office to schedule the follow-up appointment.   Nitroglycerin sublingual tablets What is this medicine? NITROGLYCERIN (nye troe GLI ser in) is a type of vasodilator. It relaxes blood vessels, increasing the blood and oxygen supply to your heart. This medicine is used to relieve chest pain caused by angina. It is also used to prevent chest pain before activities like climbing stairs, going outdoors in cold weather, or sexual activity. This medicine may be used for other purposes; ask your health care provider or pharmacist if you have questions. COMMON BRAND NAME(S): Nitroquick, Nitrostat, Nitrotab What should I tell my health care provider before I take this medicine? They need to know if you have any of these conditions: -anemia -head injury, recent stroke, or bleeding in the brain -liver disease -previous heart attack -an unusual or allergic reaction to nitroglycerin, other medicines, foods, dyes, or preservatives -pregnant or trying to get pregnant -breast-feeding How should I use this medicine? Take this medicine by mouth as needed. At the first sign of an angina attack (chest pain or tightness) place one tablet under your tongue. You can also take this medicine 5 to 10 minutes before an event likely to produce chest pain. Follow the directions on the prescription label. Let the tablet dissolve under the tongue. Do not swallow whole. Replace the dose if you accidentally swallow it. It will help if your mouth is not dry. Saliva around the tablet will help it to dissolve more quickly. Do not eat or drink, smoke or chew tobacco while a tablet is dissolving. If you are not better within 5 minutes after taking ONE dose of  nitroglycerin, call 9-1-1 immediately to seek emergency medical care. Do not take more than 3 nitroglycerin tablets over 15 minutes. If you take this medicine often to relieve symptoms of angina, your doctor or health care professional may provide you with different instructions to manage your symptoms. If symptoms do not go away after following these instructions, it is important to call 9-1-1 immediately. Do not take more than 3 nitroglycerin tablets over 15 minutes. Talk to your pediatrician regarding the use of this medicine in children. Special care may be needed. Overdosage: If you think you have taken too much of this medicine contact a poison control center or emergency room at once. NOTE: This medicine is only for you. Do not share this medicine with others. What if I miss a dose? This does not apply. This medicine is only used as needed. What may interact with this medicine? Do not take this medicine with any of the following medications: -certain migraine medicines like ergotamine and dihydroergotamine (DHE) -medicines used to treat erectile dysfunction like sildenafil, tadalafil, and vardenafil -riociguat This medicine may also interact with the following medications: -alteplase -aspirin -heparin -medicines for high blood pressure -medicines for mental depression -other medicines used to treat angina -phenothiazines like chlorpromazine, mesoridazine, prochlorperazine, thioridazine This list may not describe all possible interactions. Give your health care provider a list of all the medicines, herbs, non-prescription drugs, or dietary supplements you use. Also tell them if you smoke, drink alcohol, or use illegal drugs. Some items may interact with your medicine. What should I watch for while  using this medicine? Tell your doctor or health care professional if you feel your medicine is no longer working. Keep this medicine with you at all times. Sit or lie down when you take your  medicine to prevent falling if you feel dizzy or faint after using it. Try to remain calm. This will help you to feel better faster. If you feel dizzy, take several deep breaths and lie down with your feet propped up, or bend forward with your head resting between your knees. You may get drowsy or dizzy. Do not drive, use machinery, or do anything that needs mental alertness until you know how this drug affects you. Do not stand or sit up quickly, especially if you are an older patient. This reduces the risk of dizzy or fainting spells. Alcohol can make you more drowsy and dizzy. Avoid alcoholic drinks. Do not treat yourself for coughs, colds, or pain while you are taking this medicine without asking your doctor or health care professional for advice. Some ingredients may increase your blood pressure. What side effects may I notice from receiving this medicine? Side effects that you should report to your doctor or health care professional as soon as possible: -blurred vision -dry mouth -skin rash -sweating -the feeling of extreme pressure in the head -unusually weak or tired Side effects that usually do not require medical attention (report to your doctor or health care professional if they continue or are bothersome): -flushing of the face or neck -headache -irregular heartbeat, palpitations -nausea, vomiting This list may not describe all possible side effects. Call your doctor for medical advice about side effects. You may report side effects to FDA at 1-800-FDA-1088. Where should I keep my medicine? Keep out of the reach of children. Store at room temperature between 20 and 25 degrees C (68 and 77 degrees F). Store in [employer]. Protect from light and moisture. Keep tightly closed. Throw away any unused medicine after the expiration date. NOTE: This sheet is a summary. It may not cover all possible information. If you have questions about this medicine, talk to your doctor,  pharmacist, or health care provider.  2015, [employer] Standard. (2012-10-27 17:57:36)

## 2014-10-08 NOTE — Progress Notes (Signed)
PCP: Luz Lex, MD  Clinic Note: Chief Complaint  Patient presents with  . Follow-up    6-8 week / pt states swelling on left and right lower rib cage area, defibrillator placement  . Code STEMI    HPI: Jessica Collier is a 54 y.o. female with a PMH below who presents today for second post hospital follow-up, but first follow-up with me. . She was admitted 7/19-7/23 with VF arrest in the setting of inferior STEMI-> with Cardiac Arrest --> She underwent CPR and defib x 1 with ROSC. (Studies reviewed in bold).  The patient was somewhat hypotensive during initial stages of the hospital stay indicating possible RV infarction as part of her presenting spasm related MI.  We were finally able to get her on low-dose diltiazem and Imdur prior to discharge.   Emergent LHC 07/31/2014: Normal coronary arteries and preserved LVF.-->  It was felt that her MI was due to coronary vasospasm and probable perfusion mediated ventricular arrhythmia.   She was treated with nitrates and low dose of CCB (no BB due to spasm).   2-D Echocardiogram 08/01/2014: EF 55-60%. Normal wall motion. Normal diastolic function. Trivial MR  EP consulted: recommended C MR   Cardiac MRI: EF roughly 58%. akinesis of the apical inferior and septal walls as well as mild hypokinesis of the basal inferior and inferolateral walls. Normal RV size, but mildly decreased systolic function (RVEF = 41%) & dyskinesis of the apical portion of the RV free wall.   Transmural late gadolinium enhancement of mid inferior, apical inferior and septal walls and of the true apex. Also late gadolinium enhancement in the apical portion of the RV.  Findings consistent with transmural infarction in the RCA/PDA territory with RV involvement.  Per  Dr. Meda Coffee  abnormalities on MRI were not acute, suggesting recurrent RCA vasospasm with ultimate creation of a transmural scar & an acute event of spasm followed by ventricular fibrillation  indicating a likely interaction between recurring spasm and now a fixed myocardial infarction scar.   Dr. Caryl Comes recommended implantation of a subcutaneous ICD.   SubQ ICD: She had successful placement of [employer] SQ lead model 3401 lead serial number [id number].    DAVITA SUBLETT was last seen on August 5 by Richardson Dopp, PA. At that time she was doing well without any adverse symptoms. Just some soreness of the chest from CPR.  Recent Hospitalizations: 7/19-23/2016 - as described  Studies Reviewed: Above in bold  Interval History: Jessica Collier presents today without any major complaints whatsoever. She has an occasional cough that irritates her ribs from CPR related injury. Otherwise she's not had any recurrence of any chest tightness/pressure or dyspnea with rest or exertion. No rapid/irregular heartbeats or palpitations. She has a little swelling underneath the ICD on her left side of her chest. No redness or pain. No PND, orthopnea or edema. No palpitations, lightheadedness, dizziness, weakness or syncope/near syncope. No TIA/amaurosis fugax symptoms.  She is just a little bit anxious about what happened and how we can prevent it , and is any potential long-term damage.  Past Medical History  Diagnosis Date  . Cardiac arrest 07/31/2014    Related to spasm induced inferior STEMI  . 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  Cardiac MRI confirms area of infarction in the territory of the right coronary artery.   . 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  Marland Kitchen AICD (automatic cardioverter/defibrillator) present 08/04/2014    [facility] scientific device implanted subcutaneously because of ventricular fibrillation occurring during the setting of coronary  spasm     Past Surgical History  Procedure Laterality Date  . Cholecystectomy    . Cesarean section    . Cardiac catheterization N/A 07/31/2014    Procedure: Left Heart Cath and Coronary Angiography;  Surgeon: Troy Sine, MD;  Location: [facility];  Service: Cardiovascular;  normal coronary arteries. Normal EF  . Ep implantable device N/A 08/03/2014    Procedure: SubQ ICD Implant;  Surgeon: Deboraha Sprang, MD;  Location: [facility];  Service: Cardiovascular;  Laterality: N/A;  . Transthoracic echocardiogram  08/01/2014     Normal wall motion. Normal diastolic function. Trivial MR  . Cardiac mri  08/01/2014    EF 58%. Apical inferior and septal akinesis and mild hypokinesis of basal inferior and inferolateral walls; late gadolinium enhancement of mid inferior, apical inferior and septal walls involving true apex and RV apex consistent with transmural infarction in RCA/PDA territory with RV involvement. RVEF 41% = mildly decreased with dyskinesis of the apical portion.    ROS: A comprehensive was performed. Review of Systems  Constitutional: Negative for malaise/fatigue.  HENT: Negative for nosebleeds.   Respiratory: Positive for cough (Either allergies or cold). Negative for sputum production.   Cardiovascular: Negative for palpitations and claudication.  Gastrointestinal: Positive for heartburn and abdominal pain (Is currently having a cyst versus hernia in her umbilicus evaluated). Negative for blood in stool and melena.  Genitourinary: Negative for hematuria.  Musculoskeletal: Negative for myalgias and falls.  Neurological: Negative for dizziness.  Endo/Heme/Allergies: Does not bruise/bleed easily.  Psychiatric/Behavioral: The patient is nervous/anxious.   All other systems reviewed and are negative.   Prior to Admission medications   Medication Sig Start Date End Date Taking? Authorizing Provider  diltiazem (CARDIZEM CD) 120 MG 24 hr capsule Take 1 capsule (120  mg total) by mouth at bedtime. 08/04/14  Yes Bhavinkumar Bhagat, PA  isosorbide mononitrate (IMDUR) 30 MG 24 hr tablet Take 1 tablet (30 mg total) by mouth daily. 08/04/14  Yes Leanor Kail, PA   No Known Allergies   Social History   Social History  . Marital Status: Married    Spouse Name: N/A  . Number of Children: N/A  . Years of Education: N/A   Social History Main Topics  . Smoking status: Never Smoker   . Smokeless tobacco: Never Used  . Alcohol Use: No  . Drug Use: No  . Sexual Activity: Yes   Other Topics Concern  . None   Social History Narrative   Family History  Problem Relation Age of Onset  . Hypertension Father     Wt Readings from Last 3 Encounters:  10/08/14 144 lb 11.2 oz (65.635 kg)  08/17/14 141 lb 12.8 oz (64.32 kg)  08/01/14 148 lb 2.4 oz (67.2 kg)    PHYSICAL EXAM BP 116/70 mmHg  Pulse 82  Ht 5\' 10"  (1.778 m)  Wt 144 lb 11.2 oz (65.635 kg)  BMI 20.76 kg/m2 General appearance: alert, cooperative, appears stated age, no distress and healthy-appearing. Well-nourished and well-groomed Neck: no adenopathy, no carotid bruit and no JVD Lungs: clear to auscultation bilaterally, normal percussion bilaterally and non-labored Heart: regular rate and rhythm, S1, S2 normal, no murmur, click, rub or gallop; nondisplaced  PMI. ICD in place on the left flank/rib cage. Mild puffy swelling just below the device but no erythema. No tenderness. Abdomen: soft, non-tender; bowel sounds normal; no masses,  no organomegaly; no HJR Extremities: extremities normal, atraumatic, no cyanosis, or edema  Pulses: 2+ and symmetric;  Skin: mobility and turgor normal  Neurologic: Mental status: Alert, oriented, thought content appropriate Cranial nerves: normal (II-XII grossly intact)    Adult ECG Report Not checked  Other studies Reviewed: Additional studies/ records that were reviewed today include:  Recent Labs:  No new labs  Lab Results  Component Value Date    CHOL 159 08/01/2014   HDL 71 08/01/2014   LDLCALC 84 08/01/2014   TRIG 20 08/01/2014   CHOLHDL 2.2 08/01/2014    ASSESSMENT / PLAN: Problem List Items Addressed This Visit    VF (ventricular fibrillation)    This is in the setting of spasm related inferior MI. Because there is concern of recurrent events and to documentation of an MI, Dr. Caryl Comes felt it best for preventative therapy to place an ICD. This will be followed by Dr. Caryl Comes. She asked about when she go back to driving. Mildly impression is that she should be fine now that she has the ICD, however there may be other modifications that I would prefer to be answered by Dr. Caryl Comes based on his expertise.      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 This would indicate that she had recurrent spasms. She has not had any further episodes on diltiazem and Imdur. We still do have some room to increase diltiazem if necessary, but for now I think we'll continue current dose. She did not have any clear evidence of coronary disease, and therefore we are not using standard CAD related medications.      Relevant Medications   nitroGLYCERIN (NITROSTAT) 0.4 MG SL tablet   Coronary artery spasm (Chronic)    Currently on diltiazem and Imdur. Not using beta blockers because of data supporting adverse effects of beta blocker and 20 spasm. For same reason not on aspirin. We'll need to monitor her lipids to see if she would eventually benefit from antilipid therapy. For now her lipids look very well controlled just with diet next size.      Relevant Medications   nitroGLYCERIN (NITROSTAT) 0.4 MG SL tablet      Current medicines are reviewed at length with the patient today. (+/- concerns) how long will she need to take these medicines;  when I Start Driving The following changes have been made: None; adding when necessary nitroglycerin for spasm related pain Studies Ordered:   No orders of the defined types were placed in this encounter.    Follow-up with Dr. Caryl Comes as scheduled. Would contact his office to determine when it is safe to drive. Follow-up with Dr. Ellyn Hack 6-8 months  HARDING, Leonie Green, M.D., M.S. Interventional Cardiologist   Pager # [phone]

## 2014-10-10 ENCOUNTER — Encounter: Payer: Self-pay | Admitting: Cardiology

## 2014-10-10 NOTE — Assessment & Plan Note (Signed)
This is in the setting of spasm related inferior MI. Because there is concern of recurrent events and to documentation of an MI, Dr. Caryl Comes felt it best for preventative therapy to place an ICD. This will be followed by Dr. Caryl Comes. She asked about when she go back to driving. Mildly impression is that she should be fine now that she has the ICD, however there may be other modifications that I would prefer to be answered by Dr. Caryl Comes based on his expertise.

## 2014-10-10 NOTE — Assessment & Plan Note (Signed)
She clearly has had recurrent spasm in the RCA resulting in clear evidence of MI on MRI. This would indicate that she had recurrent spasms. She has not had any further episodes on diltiazem and Imdur. We still do have some room to increase diltiazem if necessary, but for now I think we'll continue current dose. She did not have any clear evidence of coronary disease, and therefore we are not using standard CAD related medications.

## 2014-10-10 NOTE — Assessment & Plan Note (Signed)
Currently on diltiazem and Imdur. Not using beta blockers because of data supporting adverse effects of beta blocker and 20 spasm. For same reason not on aspirin. We'll need to monitor her lipids to see if she would eventually benefit from antilipid therapy. For now her lipids look very well controlled just with diet next size.

## 2014-10-15 ENCOUNTER — Telehealth: Payer: Self-pay | Admitting: Internal Medicine

## 2014-10-15 NOTE — Telephone Encounter (Signed)
No answer/voice mail not set up/unable to leave message

## 2014-10-15 NOTE — Telephone Encounter (Signed)
New message    Patient calling asking when can she drive.

## 2014-10-16 NOTE — Telephone Encounter (Signed)
Probably the right answer is still 6 months even with the complicated story

## 2014-10-16 NOTE — Telephone Encounter (Signed)
The patient was admitted on 7/19 with an STEMI.  She developed VF arrest in the ER. S-ICD was implanted on 08/03/14.  Will forward to Dr. Caryl Comes for review- due to VF arrest- return to driving ~ 3/81/77.

## 2014-10-17 NOTE — Telephone Encounter (Signed)
I attempted to call the patient at the contact # left on the message. I was told by the person answering the phone that was the wrong #. I left a message on the home # to call.

## 2014-10-17 NOTE — Telephone Encounter (Signed)
I spoke with the patient and she is aware of Dr. Olin Pia recommendations regarding when she can drive again.

## 2014-11-06 ENCOUNTER — Other Ambulatory Visit: Payer: Self-pay | Admitting: Surgery

## 2014-11-06 DIAGNOSIS — K430 Incisional hernia with obstruction, without gangrene: Secondary | ICD-10-CM

## 2014-11-06 NOTE — H&P (Signed)
Jessica Collier. Caporaso 11/06/2014 4:23 PM Location: [facility] Patient #: [id number] DOB: 01/08/1961 Married / Language: Cleophus Molt / Race: White Female  History of Present Illness Adin Hector MD; 11/06/2014 5:57 PM) Patient words: umb hernia.  The patient is a 54 year old female who presents with an umbilical hernia. Patient sent for surgical consultation given concerned of increasingly symptomatic hernia near her belly button by her gynecologist, Dr. Louretta Shorten.  Pleasant active female. History of C-section through Pfannenstiel incision. Possibly diagnostic laparoscopy for endometriosis. Also laparoscopic cholecystectomy in 2010. Noticed a lump at her belly button a few years ago. Not really bothersome for most of that time, but more recently it is becoming more sore. She discussed with her gynecologist. Because it is becoming more symptomatic and is now stuck, surgical consultation requested. She is hoping to have it fixed before the end a year as her deductible is used up. She is normally rather active and can walk 45 minutes without difficulty. However she had episode of chest pain. 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 No problems since. Followed by Dr. Ellyn Hack with cardiology Dr. Virl Axe with electrophysiology. The patient and her husband tell me that her cardiologists are okay with her having surgery.   Other Problems Marjean Donna, [place]; 11/06/2014 4:23 PM) Cholelithiasis Congestive Heart Failure  Past Surgical History Marjean Donna, [place]; 11/06/2014 4:23 PM) Cesarean Section - Multiple Gallbladder Surgery - Laparoscopic  Diagnostic Studies History Marjean Donna, [place]; 11/06/2014 4:23 PM) Colonoscopy 1-5 years ago Mammogram within last year Pap Smear 1-5 years ago  Allergies Marjean Donna, [place]; 11/06/2014 4:24 PM) No Known Drug Allergies 11/06/2014  Medication History (Sonya Bynum, CMA; 11/06/2014  4:25 PM) Zolpidem Tartrate ER (12.5MG  Tablet ER, Oral) Active. DiltiaZEM HCl ER Coated Beads (120MG  Capsule ER 24HR, Oral) Active. Isosorbide Mononitrate ER (30MG  Tablet ER 24HR, Oral) Active. Nitrostat (0.4MG  Tab Sublingual, Sublingual as needed) Active. Medications Reconciled  Social History Marjean Donna, CMA; 11/06/2014 4:23 PM) No alcohol use No caffeine use No drug use Tobacco use Never smoker.  Family History Marjean Donna, [place]; 11/06/2014 4:23 PM) Arthritis Father. Cervical Cancer Mother.  Pregnancy / Birth History Marjean Donna, [place]; 11/06/2014 4:23 PM) Age at menarche 57 years. Gravida 2 Maternal age 68-35 Para 3     Review of Systems ([place]; 11/06/2014 4:23 PM) General Not Present- Appetite Loss, Chills, Fatigue, Fever, Night Sweats, Weight Gain and Weight Loss. Skin Not Present- Change in Wart/Mole, Dryness, Hives, Jaundice, New Lesions, Non-Healing Wounds, Rash and Ulcer. HEENT Not Present- Earache, Hearing Loss, Hoarseness, Nose Bleed, Oral Ulcers, Ringing in the Ears, Seasonal Allergies, Sinus Pain, Sore Throat, Visual Disturbances, Wears glasses/contact lenses and Yellow Eyes. Respiratory Not Present- Bloody sputum, Chronic Cough, Difficulty Breathing, Snoring and Wheezing. Breast Not Present- Breast Mass, Breast Pain, Nipple Discharge and Skin Changes. Cardiovascular Not Present- Chest Pain, Difficulty Breathing Lying Down, Leg Cramps, Palpitations, Rapid Heart Rate, Shortness of Breath and Swelling of Extremities. Gastrointestinal Present- Nausea. Not Present- Abdominal Pain, Bloating, Bloody Stool, Change in Bowel Habits, Chronic diarrhea, Constipation, Difficulty Swallowing, Excessive gas, Gets full quickly at meals, Hemorrhoids, Indigestion, Rectal Pain and Vomiting. Female Genitourinary Present- Pelvic Pain. Not Present- Frequency, Nocturia, Painful Urination and Urgency. Musculoskeletal Not Present- Back Pain, Joint Pain, Joint  Stiffness, Muscle Pain, Muscle Weakness and Swelling of Extremities. Neurological Not Present- Decreased Memory, Fainting, Headaches, Numbness, Seizures, Tingling, Tremor, Trouble walking and Weakness. Psychiatric Not Present- Anxiety, Bipolar, Change in  Sleep Pattern, Depression, Fearful and Frequent crying. Endocrine Not Present- Cold Intolerance, Excessive Hunger, Hair Changes, Heat Intolerance, Hot flashes and New Diabetes. Hematology Not Present- Easy Bruising, Excessive bleeding, Gland problems, HIV and Persistent Infections.  Vitals (Sonya Bynum CMA; 11/06/2014 4:24 PM) 11/06/2014 4:23 PM Weight: 146 lb Height: 70in Body Surface Area: 1.83 m Body Mass Index: 20.95 kg/m  Temp.: 43F(Temporal)  Pulse: 76 (Regular)  BP: 118/76 (Sitting, Left Arm, Standard)      Physical Exam Adin Hector MD; 11/06/2014 5:11 PM)  General Mental Status-Alert. General Appearance-Not in acute distress, Not Sickly. Orientation-Oriented X3. Hydration-Well hydrated. Voice-Normal. Note: Smiling affable  Integumentary Global Assessment Upon inspection and palpation of skin surfaces of the - Axillae: non-tender, no inflammation or ulceration, no drainage. and Distribution of scalp and body hair is normal. General Characteristics Temperature - normal warmth is noted.  Head and Neck Head-normocephalic, atraumatic with no lesions or palpable masses. Face Global Assessment - atraumatic, no absence of expression. Neck Global Assessment - no abnormal movements, no bruit auscultated on the right, no bruit auscultated on the left, no decreased range of motion, non-tender. Trachea-midline. Thyroid Gland Characteristics - non-tender.  Eye Eyeball - Left-Extraocular movements intact, No Nystagmus. Eyeball - Right-Extraocular movements intact, No Nystagmus. Cornea - Left-No Hazy. Cornea - Right-No Hazy. Sclera/Conjunctiva - Left-No scleral icterus, No  Discharge. Sclera/Conjunctiva - Right-No scleral icterus, No Discharge. Pupil - Left-Direct reaction to light normal. Pupil - Right-Direct reaction to light normal.  ENMT Ears Pinna - Left - no drainage observed, no generalized tenderness observed. Right - no drainage observed, no generalized tenderness observed. Nose and Sinuses External Inspection of the Nose - no destructive lesion observed. Inspection of the nares - Left - quiet respiration. Right - quiet respiration. Mouth and Throat Lips - Upper Lip - no fissures observed, no pallor noted. Lower Lip - no fissures observed, no pallor noted. Nasopharynx - no discharge present. Oral Cavity/Oropharynx - Tongue - no dryness observed. Oral Mucosa - no cyanosis observed. Hypopharynx - no evidence of airway distress observed.  Chest and Lung Exam Inspection Movements - Normal and Symmetrical. Accessory muscles - No use of accessory muscles in breathing. Palpation Palpation of the chest reveals - Non-tender. Auscultation Breath sounds - Normal and Clear.  Cardiovascular Auscultation Rhythm - Regular. Murmurs & Other Heart Sounds - Auscultation of the heart reveals - No Murmurs and No Systolic Clicks.  Abdomen Inspection Inspection of the abdomen reveals - No Visible peristalsis and No Abnormal pulsations. Umbilicus - No Bleeding, No Urine drainage. Palpation/Percussion Palpation and Percussion of the abdomen reveal - Soft, Non Tender, No Rebound tenderness, No Rigidity (guarding) and No Cutaneous hyperesthesia. Note: Thin and flat. Laparoscopic incisions well-healed. 2 x 1 cm umbilical mass consistent with incarcerated periumbilical hernia at old incisions.  Female Genitourinary Sexual Maturity Tanner 5 - Adult hair pattern. Note: No vaginal bleeding nor discharge  Peripheral Vascular Upper Extremity Inspection - Left - No Cyanotic nailbeds, Not Ischemic. Right - No Cyanotic nailbeds, Not  Ischemic.  Neurologic Neurologic evaluation reveals -normal attention span and ability to concentrate, able to name objects and repeat phrases. Appropriate fund of knowledge , normal sensation and normal coordination. Mental Status Affect - not angry, not paranoid. Cranial Nerves-Normal Bilaterally. Gait-Normal.  Neuropsychiatric Mental status exam performed with findings of-able to articulate well with normal speech/language, rate, volume and coherence, thought content normal with ability to perform basic computations and apply abstract reasoning and no evidence of hallucinations, delusions, obsessions or homicidal/suicidal ideation.  Musculoskeletal Global Assessment Spine, Ribs and Pelvis - no instability, subluxation or laxity. Right Upper Extremity - no instability, subluxation or laxity.  Lymphatic Head & Neck  General Head & Neck Lymphatics: Bilateral - Description - No Localized lymphadenopathy. Axillary  General Axillary Region: Bilateral - Description - No Localized lymphadenopathy. Femoral & Inguinal  Generalized Femoral & Inguinal Lymphatics: Left - Description - No Localized lymphadenopathy. Right - Description - No Localized lymphadenopathy.    Assessment & Plan Adin Hector MD; 11/06/2014 5:10 PM)  Mickel Duhamel HERNIA ([id number]) Impression: Small been sensitive incarcerated periumbilical hernia most likely related to incisions from prior diagnostic laparoscopy for endometriosis and laparoscopic cholecystectomy.  Toes it is incarcerated and symptomatic, think she would benefit from surgical repair at some point. Because it is recurrent from probably prior incisions, I wouldn't use mesh underlay repair to lower chance of recurrence given her young age and activity level. Would do a laparoscopic underlay with closure on top of it. Perhaps some increased pain in the short-term but less recurrence rate in the long-term.  The patient husband are  interested in proceeding. They assure me that her cardiologist and cleared her. I would still like a formal clearance to keep anesthesia assured  Current Plans I recommended obtaining preoperative cardiac clearance. I am concerned about the health of the patient and the ability to tolerate the operation. Therefore, we will request clearance by cardiology to better assess operative risk & see if a reevaluation, further workup, etc is needed. Most likely will get clearance from Drs. Ellyn Hack and declined given her nonatherosclerotic spasm type disease. Also make sure that defibrillator not an issue. Also recommendations on how medications such as for anticoagulation and blood pressure should be managed/held/restarted after surgery. You are being scheduled for surgery - Our schedulers will call you.  You should hear from our office's scheduling department within 5 working days about the location, date, and time of surgery. We try to make accommodations for patient's preferences in scheduling surgery, but sometimes the OR schedule or the surgeon's schedule prevents [place] from making those accommodations.  If you have not heard from our office [phone]) in 5 working days, call the office and ask for your surgeon's nurse.  If you have other questions about your diagnosis, plan, or surgery, call the office and ask for your surgeon's nurse.  Written instructions provided Pt Education - Pamphlet Given - Laparoscopic Hernia Repair: discussed with patient and provided information. The anatomy & physiology of the abdominal wall was discussed. The pathophysiology of hernias was discussed. Natural history risks without surgery including progeressive enlargement, pain, incarceration, & strangulation was discussed. Contributors to complications such as smoking, obesity, diabetes, prior surgery, etc were discussed.  I feel the risks of no intervention will lead to serious problems that outweigh the operative risks;  therefore, I recommended surgery to reduce and repair the hernia. I explained laparoscopic techniques with possible need for an open approach. I noted the probable use of mesh to patch and/or buttress the hernia repair  Risks such as bleeding, infection, abscess, need for further treatment, heart attack, death, and other risks were discussed. I noted a good likelihood this will help address the problem. Goals of post-operative recovery were discussed as well. Possibility that this will not correct all symptoms was explained. I stressed the importance of low-impact activity, aggressive pain control, avoiding constipation, & not pushing through pain to minimize risk of post-operative chronic pain or injury. Possibility of reherniation especially with smoking, obesity, diabetes,  immunosuppression, and other health conditions was discussed. We will work to minimize complications.  An educational handout further explaining the pathology & treatment options was given as well. Questions were answered. The patient expresses understanding & wishes to proceed with surgery.  Pt Education - [facility] Hernia Post-Op HCI (Malgorzata Albert): discussed with patient and provided information. Pt Education - [facility] Pain Control (Zephyr Ridley)  Adin Hector, M.D., F.A.C.S. Gastrointestinal and Minimally Invasive Surgery [facility] [address], [place], [place] [address] [phone] Main / Paging

## 2014-11-07 NOTE — Progress Notes (Signed)
She did mention something about this surgery. I think she should be fine for Laparoscopic surgery.  She is on stable dose of diltiazem and Imdur. Not on any anticoagulations. She does not have any heart failure symptoms and is preserved. She had V. tach in the setting of vasospasm that was spasm mediated & not primary arrhythmia.  Leonie Man, MD

## 2014-11-08 ENCOUNTER — Telehealth: Payer: Self-pay | Admitting: *Deleted

## 2014-11-08 NOTE — Telephone Encounter (Signed)
Request for surgical clearance:  1. What type of surgery is being performed?  LAPAROSCOPIC PERIUMBILICAL  HERNIA REPAIR- UNDER GENERAL ANESTHESIA  2. When is this surgery scheduled?  TO BE SCHEDULE   3. Are there any medications that need to be held prior to surgery and how long? none  4. Name of physician performing surgery? Dr Remo Lipps GROSS  5. What is your office phone and fax number?  [place] [phone],   PHONE [phone]

## 2014-11-08 NOTE — Telephone Encounter (Signed)
PREOPERATIVE CARDIAC RISK ASSESSMENT   Revised Cardiac Risk Index:  High Risk Surgery: no; But if converted to Open, yes  Defined as Intraperitoneal, intrathoracic or suprainguinal vascular  Active CAD: no; > 3 months out from Spasm induced I/VT  CHF: no;   Cerebrovascular Disease: no;   Diabetes: no; On Insulin: no  CKD (Cr >~ 2): no;   Total: 1 Estimated Risk of Adverse Outcome: LOW RISK PATIENT for HIGH RISK PATIENT  Estimated Risk of MI, PE, VF/VT (Cardiac Arrest), Complete Heart Block: <1 %   ACC/AHA Guidelines for "Clearance":  Step 1 - Need for Emergency Surgery: No:   If Yes - go straight to OR with perioperative surveillance  Step 2 - Active Cardiac Conditions (Unstable Angina, Decompensated HF, Significant  Arrhytmias - Complete HB, Mobitz II, Symptomatic VT or SVT, Severe Aortic Stenosis - mean gradient > 40 mmHg, Valve area < 1.0 cm2):   No:   If Yes - Evaluate & Treat per ACC/AHA Guidelines  Step 3 -  Low Risk Surgery: No:   If Yes --> proceed to OR  If No --> Step 4  Step 4 - Functional Capacity >= 4 METS without symptoms: Yes  If Yes --> proceed to OR  If No --> Step 5  Step 5 --  Clinical Risk Factors (CRF)   1-2 or more CRFs: Yes - DOES have h/o Coronary Spasm.  High RIsk Surgery.  If Yes -- assess Surgical Risk, --   (High Risk Non-cardiac), Intraabdominal or thoracic vascular surgery --> Proceed to OR, or consider testing if it will change management.--> Would not change management.  OK TO PROCEED TO OR - LOW RISK PATIENT.  NO NEED FOR ADDITIONAL EVALUATION, CONTINUE CALCIUM CHANNEL BLOCKER & NITRATE SHE DOES HAVE A SUBCUTANEOUS ICD (WILL DEFER MANAGEMENT OF THIS TO DR. KLEIN - FROM ELECTROPHYSIOLOGY).   Jessica Collier, M.D., M.S. Interventional Cardiologist   Pager # [phone]

## 2014-11-09 NOTE — Progress Notes (Signed)
The concerns would be the risk of vasoactive ( alpha stimulation or the use of beta blockers )

## 2014-11-09 NOTE — Telephone Encounter (Signed)
Forward to [place]

## 2014-11-19 ENCOUNTER — Encounter: Payer: Self-pay | Admitting: Internal Medicine

## 2014-11-19 ENCOUNTER — Ambulatory Visit (INDEPENDENT_AMBULATORY_CARE_PROVIDER_SITE_OTHER): Payer: BC Managed Care – PPO | Admitting: Internal Medicine

## 2014-11-19 ENCOUNTER — Ambulatory Visit: Payer: BC Managed Care – PPO | Admitting: Internal Medicine

## 2014-11-19 VITALS — BP 108/90 | HR 69 | Ht 70.0 in | Wt 145.0 lb

## 2014-11-19 DIAGNOSIS — I4901 Ventricular fibrillation: Secondary | ICD-10-CM | POA: Diagnosis not present

## 2014-11-19 DIAGNOSIS — Z95810 Presence of automatic (implantable) cardiac defibrillator: Secondary | ICD-10-CM | POA: Diagnosis not present

## 2014-11-19 LAB — CUP PACEART INCLINIC DEVICE CHECK
Date Time Interrogation Session: 20161107171648
MDC IDC LEAD IMPLANT DT: 20160722
MDC IDC LEAD LOCATION: 753858
MDC IDC LEAD MODEL: 3401
Pulse Gen Serial Number: 112653

## 2014-11-19 NOTE — Patient Instructions (Signed)
Medication Instructions: 1) Decrease Imdur to 30 mg 1/2 tablet (15 mg) by mouth at night  Labwork: - none  Procedures/Testing: - none  Follow-Up: - Your physician wants you to follow-up in: 9 months with Dr. Caryl Comes. You will receive a reminder letter in the mail two months in advance. If you don't receive a letter, please call our office to schedule the follow-up appointment.  Any Additional Special Instructions Will Be Listed Below (If Applicable).

## 2014-11-19 NOTE — Progress Notes (Signed)
Patient Care Team: Louretta Shorten, MD as PCP - General (Obstetrics and Gynecology) Michael Boston, MD as Consulting Physician (General Surgery) Leonie Man, MD as Consulting Physician (Cardiology) Deboraha Sprang, MD as Consulting Physician (Cardiology)   HPI  Jessica Collier is a 54 y.o. female Seen in follow-up for cardiac arrest related to inferior wall spasm. MRI scanning had demonstrated scarring suggesting prior episodes of spasm as well. She underwent ICD implantation for secondary prevention--subcutaneous  She's had no intercurrent arrhythmia. She is running/walking. She is back to work.  Recent change in the formulation of her isosorbide was associated with increasing problems with orthostatic lightheadedness. Her volume status is somewhat deplete.  Past Medical History  Diagnosis Date  . Cardiac arrest ([place]) 07/31/2014    Related to spasm induced inferior STEMI  . 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  Cardiac MRI confirms area of infarction in the territory of the right coronary artery.   . 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  Marland Kitchen AICD (automatic cardioverter/defibrillator) present 08/04/2014    [facility] scientific device implanted subcutaneously because of ventricular fibrillation occurring during the setting of coronary spasm     Past Surgical History  Procedure Laterality Date  . Cholecystectomy    . Cesarean section    . Cardiac catheterization N/A 07/31/2014    Procedure: Left Heart Cath and Coronary Angiography;  Surgeon: Troy Sine, MD;  Location: [facility];  Service: Cardiovascular;  normal coronary arteries. Normal EF  . Ep implantable device N/A 08/03/2014    Procedure: SubQ ICD Implant;  Surgeon:  Deboraha Sprang, MD;  Location: [facility];  Service: Cardiovascular;  Laterality: N/A;  . Transthoracic echocardiogram  08/01/2014     Normal wall motion. Normal diastolic function. Trivial MR  . Cardiac mri  08/01/2014    EF 58%. Apical inferior and septal akinesis and mild hypokinesis of basal inferior and inferolateral walls; late gadolinium enhancement of mid inferior, apical inferior and septal walls involving true apex and RV apex consistent with transmural infarction in RCA/PDA territory with RV involvement. RVEF 41% = mildly decreased with dyskinesis of the apical portion.    Current Outpatient Prescriptions  Medication Sig Dispense Refill  . diltiazem (CARDIZEM CD) 120 MG 24 hr capsule Take 1 capsule (120 mg total) by mouth at bedtime. 30 capsule 6  . isosorbide mononitrate (IMDUR) 30 MG 24 hr tablet Take 1 tablet (30 mg total) by mouth daily. 30 tablet 6  . nitroGLYCERIN (NITROSTAT) 0.4 MG SL tablet Place 1 tablet (0.4 mg total) under the tongue every 5 (five) minutes as needed for chest pain. 25 tablet 6   No current facility-administered medications for this visit.    No Known Allergies    Review of Systems negative except from HPI and PMH  Physical Exam BP 108/90 mmHg  Pulse 69  Ht 5\' 10"  (1.778 m)  Wt 145 lb (65.772 kg)  BMI 20.81 kg/m2 Well developed and well nourished in no acute distress HENT normal E scleral and icterus clear Neck Supple JVP flat; carotids brisk and full Clear to ausculation Device pocket well healed; without hematoma or erythema.  There is no tethering *Regular rate and rhythm, no murmurs gallops or rub Soft with  active bowel sounds No clubbing cyanosis  Edema Alert and oriented, grossly normal motor and sensory function Skin Warm and Dry  ECG demonstrates sinus rhythm at 69 intervals 17/08/41 Axis XLIV  Assessment and  Plan  Coronary spasm  Cardiac arrest associated with the above  Myocardial infarction associated with the  above  Orthostatic lightheadedness  Preoperative evaluation    [name] is done beautifully. She is back to work when she is working on fitness. She has noted some change in orthostatic intolerance following new formulation of her isosorbide; we will have her decrease it from 30--15 and have her take it at night.  As relates to surgery, the major concern would be the use of vasoactive agents that may contribute to coronary artery vasospasm. Apart from that and management of the defibrillator she should be an acceptable risk for surgery

## 2014-11-22 NOTE — Addendum Note (Signed)
Addended by: Freada Bergeron on: 11/22/2014 02:27 PM   Modules accepted: Orders

## 2014-11-29 ENCOUNTER — Encounter: Payer: Self-pay | Admitting: Internal Medicine

## 2014-12-17 NOTE — Pre-Procedure Instructions (Signed)
Jessica Collier  12/17/2014     Your procedure is scheduled on :Wednesday December 26, 2014 at 1:30 PM.  Report to [facility] Admitting at 11:30 AM.  Call this number if you have problems the morning of surgery: [phone]    Remember:  Do not eat food or drink liquids after midnight.  Take these medicines the morning of surgery with A SIP OF WATER : NONE   Please stop taking any aspirin, vitamins, herbal medications, Ibuprofen, Advil, Motrin, etc on Saturday December 10th.   Do not wear jewelry, make-up or nail polish.  Do not wear lotions, powders, or perfumes.    Do not shave 48 hours prior to surgery.    Do not bring valuables to the hospital.  [facility] is not responsible for any belongings or valuables.  Contacts, dentures or bridgework may not be worn into surgery.  Leave your suitcase in the car.  After surgery it may be brought to your room.  For patients admitted to the hospital, discharge time will be determined by your treatment team.  Patients discharged the day of surgery will not be allowed to drive home.   Name and phone number of your driver:    Special instructions:  Shower using CHG soap the night before and the morning of your surgery  Please read over the following fact sheets that you were given. Pain Booklet, Blood Transfusion Information and Surgical Site Infection Prevention

## 2014-12-17 NOTE — Progress Notes (Signed)
Nurse called Dr. Johney Maine office about the reason listed on consent form. Nurse was connected to triage Nurse and triage Nurse was unable to let Nurse know if "eriumbilical" was the correct term to be used on consent, or if it was supposed to be "periumbilical." Triage Nurse stated she would send Dr . Johney Maine a message because she was unable to see the consent form that was placed in EPIC. Direct number to preadmit given to triage nurse.

## 2014-12-18 ENCOUNTER — Other Ambulatory Visit: Payer: Self-pay | Admitting: Surgery

## 2014-12-18 ENCOUNTER — Encounter (HOSPITAL_COMMUNITY)
Admission: RE | Admit: 2014-12-18 | Discharge: 2014-12-18 | Disposition: A | Discharge status: Home / Self Care | Payer: BC Managed Care – PPO | Source: Ambulatory Visit | Attending: Surgery | Admitting: Surgery

## 2014-12-18 ENCOUNTER — Encounter (HOSPITAL_COMMUNITY): Payer: Self-pay

## 2014-12-18 DIAGNOSIS — Z79899 Other long term (current) drug therapy: Secondary | ICD-10-CM | POA: Diagnosis not present

## 2014-12-18 DIAGNOSIS — I252 Old myocardial infarction: Secondary | ICD-10-CM | POA: Diagnosis not present

## 2014-12-18 DIAGNOSIS — Z01812 Encounter for preprocedural laboratory examination: Secondary | ICD-10-CM | POA: Diagnosis not present

## 2014-12-18 DIAGNOSIS — Z95810 Presence of automatic (implantable) cardiac defibrillator: Secondary | ICD-10-CM | POA: Insufficient documentation

## 2014-12-18 DIAGNOSIS — K432 Incisional hernia without obstruction or gangrene: Secondary | ICD-10-CM | POA: Diagnosis not present

## 2014-12-18 DIAGNOSIS — Z01818 Encounter for other preprocedural examination: Secondary | ICD-10-CM | POA: Insufficient documentation

## 2014-12-18 HISTORY — DX: Other specified postprocedural states: Z98.890

## 2014-12-18 HISTORY — DX: Nausea with vomiting, unspecified: R11.2

## 2014-12-18 LAB — CBC
HCT: 40.2 % (ref 36.0–46.0)
Hemoglobin: 13.2 g/dL (ref 12.0–15.0)
MCH: 28.5 pg (ref 26.0–34.0)
MCHC: 32.8 g/dL (ref 30.0–36.0)
MCV: 86.8 fL (ref 78.0–100.0)
Platelets: 186 10*3/uL (ref 150–400)
RBC: 4.63 MIL/uL (ref 3.87–5.11)
RDW: 12.7 % (ref 11.5–15.5)
WBC: 4.6 10*3/uL (ref 4.0–10.5)

## 2014-12-18 LAB — BASIC METABOLIC PANEL
ANION GAP: 5 (ref 5–15)
BUN: 12 mg/dL (ref 6–20)
CALCIUM: 9.2 mg/dL (ref 8.9–10.3)
CO2: 28 mmol/L (ref 22–32)
Chloride: 105 mmol/L (ref 101–111)
Creatinine, Ser: 0.74 mg/dL (ref 0.44–1.00)
GFR calc non Af Amer: >60 mL/min (ref >60)
Glucose, Bld: 125 mg/dL — ABNORMAL HIGH (ref 65–99)
Potassium: 5 mmol/L (ref 3.5–5.1)
Sodium: 138 mmol/L (ref 135–145)

## 2014-12-18 LAB — HCG, SERUM, QUALITATIVE: Preg, Serum: NEGATIVE

## 2014-12-18 NOTE — Progress Notes (Signed)
PCP is Louretta Shorten  Cardiologist is Glenetta Hew  Dr. Caryl Comes handles patients ICD. Perioperative device programming sheet faxed to Dr. Olin Pia office on 12/17/14 at 1340 and again on 12/18/14 at 0938. Awaiting results and will notify [facility] once device programming sheet returns from Dr. Caryl Comes.   Patient denied having any acute cardiac or pulmonary issues. Patients husband at chair side during PAT visit.

## 2014-12-18 NOTE — Progress Notes (Signed)
Anesthesia Chart Review:  Pt is 54 year old female scheduled for laparoscopic incisional ventral wall hernia repair with mesh on 12/26/2014 with Dr. Johney Maine.   Cardiologist is Dr. Glenetta Hew. EP cardiologist is Dr. Virl Axe.   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 Never smoker. BMI 21.  Medications include: diltiazem, imdur.   Preoperative labs reviewed.    Chest x-ray 08/04/14 reviewed. No acute abnormality following defibrillator placement.   EKG 11/19/14: NSR. Possible LAE.   Cardiac cath 07/31/14:  The left ventricular systolic function is normal.  Normal coronary arteries.  Echo 08/01/14:  - Left ventricle: The cavity size was normal. Systolic function was normal. The estimated ejection fraction was in the range of 55% to 60%. Wall motion was normal; there were no regional wall motion abnormalities. Left ventricular diastolic function parameters were normal. - Aortic valve: Trileaflet; normal thickness leaflets. There was no regurgitation. - Aortic root: The aortic root was normal in size. - Mitral valve: Structurally normal valve. There was trivial regurgitation. - Left atrium: The atrium was normal in size. - Right ventricle: Systolic function was normal. - Right atrium: The atrium was normal in size. - Tricuspid valve: Structurally normal valve. There was no regurgitation. - Pulmonic valve: There was no regurgitation. - Inferior vena cava: The vessel was normal in size. - Pericardium, extracardiac: There was no pericardial effusion.   Pt has cardiac clearance for surgery from Dr. Ellyn Hack in [place] telephone encounter dated 11/08/14. Dr. Caryl Comes is aware of upcoming surgery his note dated 11/19/14 indicates "As relates to surgery, the major concern would be the use of vasoactive agents that may contribute to coronary artery vasospasm. Apart from that and management of the  defibrillator she should be an acceptable risk for surgery".  If no changes, I anticipate pt can proceed with surgery as scheduled.   Willeen Cass, FNP-BC [facility]/Anesthesiology Phone: [phone] 12/18/2014 3:48 PM

## 2014-12-25 MED ORDER — BUPIVACAINE LIPOSOME 1.3 % IJ SUSP
20.0000 mL | INTRAMUSCULAR | Status: DC
  Filled 2014-12-25: qty 20

## 2014-12-25 NOTE — Progress Notes (Addendum)
Paged [place].  Spoke with Kerry Dory Scientific Rep about patient being 1330 surg (hernia repair) 12/26/14.  Corene Cornea stated patient's ICD underarm responds to magnet just as your typical ICD.  Corene Cornea will be available if needed.

## 2014-12-26 ENCOUNTER — Ambulatory Visit (HOSPITAL_COMMUNITY)
Admission: RE | Admit: 2014-12-26 | Discharge: 2014-12-26 | Disposition: A | Discharge status: Home / Self Care | Payer: BC Managed Care – PPO | Source: Ambulatory Visit | Attending: Surgery | Admitting: Surgery

## 2014-12-26 ENCOUNTER — Encounter (HOSPITAL_COMMUNITY): Payer: Self-pay | Admitting: Certified Registered Nurse Anesthetist

## 2014-12-26 ENCOUNTER — Ambulatory Visit (HOSPITAL_COMMUNITY): Payer: BC Managed Care – PPO | Admitting: Certified Registered Nurse Anesthetist

## 2014-12-26 ENCOUNTER — Ambulatory Visit (HOSPITAL_COMMUNITY): Payer: BC Managed Care – PPO | Admitting: Emergency Medicine

## 2014-12-26 ENCOUNTER — Encounter (HOSPITAL_COMMUNITY)
Admission: RE | Disposition: A | Discharge status: Home / Self Care | Payer: Self-pay | Source: Ambulatory Visit | Attending: Surgery

## 2014-12-26 DIAGNOSIS — K432 Incisional hernia without obstruction or gangrene: Secondary | ICD-10-CM | POA: Diagnosis not present

## 2014-12-26 DIAGNOSIS — Z79899 Other long term (current) drug therapy: Secondary | ICD-10-CM | POA: Diagnosis not present

## 2014-12-26 DIAGNOSIS — Z95810 Presence of automatic (implantable) cardiac defibrillator: Secondary | ICD-10-CM | POA: Diagnosis not present

## 2014-12-26 DIAGNOSIS — I213 ST elevation (STEMI) myocardial infarction of unspecified site: Secondary | ICD-10-CM | POA: Diagnosis not present

## 2014-12-26 DIAGNOSIS — Z8674 Personal history of sudden cardiac arrest: Secondary | ICD-10-CM | POA: Insufficient documentation

## 2014-12-26 DIAGNOSIS — K430 Incisional hernia with obstruction, without gangrene: Secondary | ICD-10-CM | POA: Diagnosis present

## 2014-12-26 HISTORY — PX: INSERTION OF MESH: SHX5868

## 2014-12-26 HISTORY — PX: VENTRAL HERNIA REPAIR: SHX424

## 2014-12-26 SURGERY — REPAIR, HERNIA, VENTRAL, LAPAROSCOPIC
Anesthesia: General | Site: Abdomen

## 2014-12-26 MED ORDER — BUPIVACAINE-EPINEPHRINE (PF) 0.25% -1:200000 IJ SOLN
INTRAMUSCULAR | Status: AC
  Filled 2014-12-26: qty 30

## 2014-12-26 MED ORDER — NEOSTIGMINE METHYLSULFATE 10 MG/10ML IV SOLN
INTRAVENOUS | Status: AC
  Filled 2014-12-26: qty 1

## 2014-12-26 MED ORDER — KETOROLAC TROMETHAMINE 30 MG/ML IJ SOLN
INTRAMUSCULAR | Status: AC
  Filled 2014-12-26: qty 1

## 2014-12-26 MED ORDER — HYDROMORPHONE HCL 1 MG/ML IJ SOLN: 0.2500 mg | INTRAMUSCULAR | Status: DC | PRN

## 2014-12-26 MED ORDER — MIDAZOLAM HCL 2 MG/2ML IJ SOLN
INTRAMUSCULAR | Status: AC
  Filled 2014-12-26: qty 2

## 2014-12-26 MED ORDER — LACTATED RINGERS IV SOLN
INTRAVENOUS | Status: DC
  Administered 2014-12-26 (×3): via INTRAVENOUS

## 2014-12-26 MED ORDER — OXYCODONE HCL 5 MG/5ML PO SOLN
5.0000 mg | Freq: Once | ORAL | Status: AC | PRN
Start: 2014-12-26 — End: 2014-12-26

## 2014-12-26 MED ORDER — PROPOFOL 10 MG/ML IV BOLUS
INTRAVENOUS | Status: DC | PRN
  Administered 2014-12-26: 10 mg via INTRAVENOUS
  Administered 2014-12-26: 120 mg via INTRAVENOUS

## 2014-12-26 MED ORDER — NEOSTIGMINE METHYLSULFATE 10 MG/10ML IV SOLN
INTRAVENOUS | Status: DC | PRN
  Administered 2014-12-26: 3 mg via INTRAVENOUS

## 2014-12-26 MED ORDER — SCOPOLAMINE 1 MG/3DAYS TD PT72
1.0000 | MEDICATED_PATCH | TRANSDERMAL | Status: DC
  Filled 2014-12-26: qty 1

## 2014-12-26 MED ORDER — CHLORHEXIDINE GLUCONATE 4 % EX LIQD
1.0000 | Freq: Once | CUTANEOUS | Status: DC
Start: 2014-12-27 — End: 2014-12-26

## 2014-12-26 MED ORDER — OXYCODONE HCL 5 MG PO TABS
5.0000 mg | ORAL_TABLET | Freq: Once | ORAL | Status: AC | PRN
  Administered 2014-12-26: 5 mg via ORAL

## 2014-12-26 MED ORDER — 0.9 % SODIUM CHLORIDE (POUR BTL) OPTIME
TOPICAL | Status: DC | PRN
  Administered 2014-12-26: 1000 mL

## 2014-12-26 MED ORDER — CHLORHEXIDINE GLUCONATE 4 % EX LIQD: 1.0000 "application " | Freq: Once | CUTANEOUS | Status: DC

## 2014-12-26 MED ORDER — ROCURONIUM BROMIDE 50 MG/5ML IV SOLN
INTRAVENOUS | Status: AC
  Filled 2014-12-26: qty 1

## 2014-12-26 MED ORDER — EPHEDRINE SULFATE 50 MG/ML IJ SOLN
INTRAMUSCULAR | Status: DC | PRN
  Administered 2014-12-26 (×2): 5 mg via INTRAVENOUS

## 2014-12-26 MED ORDER — FENTANYL CITRATE (PF) 250 MCG/5ML IJ SOLN
INTRAMUSCULAR | Status: AC
  Filled 2014-12-26: qty 5

## 2014-12-26 MED ORDER — SCOPOLAMINE 1 MG/3DAYS TD PT72
MEDICATED_PATCH | TRANSDERMAL | Status: AC
  Administered 2014-12-26: 1.5 mg
  Filled 2014-12-26: qty 1

## 2014-12-26 MED ORDER — ONDANSETRON HCL 4 MG/2ML IJ SOLN: 4.0000 mg | Freq: Four times a day (QID) | INTRAMUSCULAR | Status: DC | PRN

## 2014-12-26 MED ORDER — OXYCODONE HCL 5 MG PO TABS: 5.0000 mg | ORAL_TABLET | ORAL | Status: DC | PRN

## 2014-12-26 MED ORDER — ROCURONIUM BROMIDE 100 MG/10ML IV SOLN
INTRAVENOUS | Status: DC | PRN
  Administered 2014-12-26: 40 mg via INTRAVENOUS

## 2014-12-26 MED ORDER — ONDANSETRON HCL 4 MG/2ML IJ SOLN
INTRAMUSCULAR | Status: AC
  Filled 2014-12-26: qty 2

## 2014-12-26 MED ORDER — LIDOCAINE HCL (CARDIAC) 20 MG/ML IV SOLN
INTRAVENOUS | Status: DC | PRN
  Administered 2014-12-26: 100 mg via INTRAVENOUS
  Administered 2014-12-26: 20 mg via INTRAVENOUS
  Administered 2014-12-26: 80 mg via INTRAVENOUS

## 2014-12-26 MED ORDER — KETOROLAC TROMETHAMINE 30 MG/ML IJ SOLN
INTRAMUSCULAR | Status: DC | PRN
  Administered 2014-12-26: 30 mg via INTRAVENOUS

## 2014-12-26 MED ORDER — GLYCOPYRROLATE 0.2 MG/ML IJ SOLN
INTRAMUSCULAR | Status: DC | PRN
  Administered 2014-12-26: 0.4 mg via INTRAVENOUS

## 2014-12-26 MED ORDER — GLYCOPYRROLATE 0.2 MG/ML IJ SOLN
INTRAMUSCULAR | Status: AC
  Filled 2014-12-26: qty 2

## 2014-12-26 MED ORDER — BUPIVACAINE-EPINEPHRINE 0.25% -1:200000 IJ SOLN
INTRAMUSCULAR | Status: DC | PRN
  Administered 2014-12-26 (×2): 30 mL

## 2014-12-26 MED ORDER — FENTANYL CITRATE (PF) 100 MCG/2ML IJ SOLN
INTRAMUSCULAR | Status: DC | PRN
  Administered 2014-12-26: 100 ug via INTRAVENOUS

## 2014-12-26 MED ORDER — DEXAMETHASONE SODIUM PHOSPHATE 10 MG/ML IJ SOLN
INTRAMUSCULAR | Status: DC | PRN
  Administered 2014-12-26: 10 mg via INTRAVENOUS

## 2014-12-26 MED ORDER — OXYCODONE HCL 5 MG PO TABS
ORAL_TABLET | ORAL | Status: AC
  Filled 2014-12-26: qty 1

## 2014-12-26 MED ORDER — CEFAZOLIN SODIUM-DEXTROSE 2-3 GM-% IV SOLR
INTRAVENOUS | Status: DC
Start: 2014-12-26 — End: 2014-12-26
  Filled 2014-12-26: qty 50

## 2014-12-26 MED ORDER — DEXAMETHASONE SODIUM PHOSPHATE 10 MG/ML IJ SOLN
INTRAMUSCULAR | Status: AC
  Filled 2014-12-26: qty 1

## 2014-12-26 MED ORDER — CEFAZOLIN SODIUM-DEXTROSE 2-3 GM-% IV SOLR
2.0000 g | INTRAVENOUS | Status: AC
  Administered 2014-12-26: 2 g via INTRAVENOUS

## 2014-12-26 MED ORDER — PROPOFOL 10 MG/ML IV BOLUS
INTRAVENOUS | Status: AC
  Filled 2014-12-26: qty 20

## 2014-12-26 MED ORDER — LIDOCAINE HCL (CARDIAC) 20 MG/ML IV SOLN
INTRAVENOUS | Status: AC
  Filled 2014-12-26: qty 10

## 2014-12-26 MED ORDER — MIDAZOLAM HCL 5 MG/5ML IJ SOLN
INTRAMUSCULAR | Status: DC | PRN
  Administered 2014-12-26: 2 mg via INTRAVENOUS

## 2014-12-26 MED ORDER — ONDANSETRON HCL 4 MG/2ML IJ SOLN
INTRAMUSCULAR | Status: DC | PRN
  Administered 2014-12-26: 4 mg via INTRAVENOUS

## 2014-12-26 SURGICAL SUPPLY — 56 items
APPLICATOR COTTON TIP 6IN STRL (MISCELLANEOUS) IMPLANT
APPLIER CLIP LOGIC TI 5 (MISCELLANEOUS) IMPLANT
APR CLP MED LRG 33X5 (MISCELLANEOUS)
BINDER ABD UNIV 12 45-62 (WOUND CARE) IMPLANT
BINDER ABDOMINAL 46IN 62IN (WOUND CARE)
BLADE SURG ROTATE 9660 (MISCELLANEOUS) IMPLANT
CANISTER SUCTION 2500CC (MISCELLANEOUS) ×2 IMPLANT
CHLORAPREP W/TINT 26ML (MISCELLANEOUS) ×2 IMPLANT
COVER SURGICAL LIGHT HANDLE (MISCELLANEOUS) ×2 IMPLANT
DEVICE SECURE STRAP 25 ABSORB (INSTRUMENTS) ×1 IMPLANT
DEVICE TROCAR PUNCTURE CLOSURE (ENDOMECHANICALS) ×2 IMPLANT
DRAPE LAPAROSCOPIC ABDOMINAL (DRAPES) ×2 IMPLANT
DRAPE WARM FLUID 44X44 (DRAPE) ×2 IMPLANT
DRSG TEGADERM 2-3/8X2-3/4 SM (GAUZE/BANDAGES/DRESSINGS) ×3 IMPLANT
DRSG TEGADERM 4X4.75 (GAUZE/BANDAGES/DRESSINGS) ×1 IMPLANT
ELECT REM PT RETURN 9FT ADLT (ELECTROSURGICAL) ×2
ELECTRODE REM PT RTRN 9FT ADLT (ELECTROSURGICAL) ×1 IMPLANT
GAUZE SPONGE 2X2 8PLY STRL LF (GAUZE/BANDAGES/DRESSINGS) ×1 IMPLANT
GLOVE BIOGEL PI IND STRL 7.0 (GLOVE) IMPLANT
GLOVE BIOGEL PI IND STRL 8 (GLOVE) ×1 IMPLANT
GLOVE BIOGEL PI INDICATOR 7.0 (GLOVE) ×2
GLOVE BIOGEL PI INDICATOR 8 (GLOVE) ×1
GLOVE ECLIPSE 8.0 STRL XLNG CF (GLOVE) ×2 IMPLANT
GLOVE SS BIOGEL STRL SZ 7.5 (GLOVE) IMPLANT
GLOVE SUPERSENSE BIOGEL SZ 7.5 (GLOVE) ×1
GOWN STRL REUS W/ TWL LRG LVL3 (GOWN DISPOSABLE) ×2 IMPLANT
GOWN STRL REUS W/ TWL XL LVL3 (GOWN DISPOSABLE) ×1 IMPLANT
GOWN STRL REUS W/TWL LRG LVL3 (GOWN DISPOSABLE) ×4
GOWN STRL REUS W/TWL XL LVL3 (GOWN DISPOSABLE) ×2
KIT BASIN OR (CUSTOM PROCEDURE TRAY) ×2 IMPLANT
KIT ROOM TURNOVER OR (KITS) ×2 IMPLANT
MARKER SKIN DUAL TIP RULER LAB (MISCELLANEOUS) ×2 IMPLANT
MESH VENTRALIGHT ST 4X6IN (Mesh General) ×1 IMPLANT
NDL SPNL 22GX3.5 QUINCKE BK (NEEDLE) ×1 IMPLANT
NEEDLE SPNL 22GX3.5 QUINCKE BK (NEEDLE) ×2 IMPLANT
NS IRRIG 1000ML POUR BTL (IV SOLUTION) ×4 IMPLANT
PAD ARMBOARD 7.5X6 YLW CONV (MISCELLANEOUS) ×4 IMPLANT
SCALPEL HARMONIC ACE (MISCELLANEOUS) IMPLANT
SCISSORS LAP 5X35 DISP (ENDOMECHANICALS) ×2 IMPLANT
SET IRRIG TUBING LAPAROSCOPIC (IRRIGATION / IRRIGATOR) ×2 IMPLANT
SLEEVE ENDOPATH XCEL 5M (ENDOMECHANICALS) ×4 IMPLANT
SPONGE GAUZE 2X2 STER 10/PKG (GAUZE/BANDAGES/DRESSINGS) ×1
STRIP CLOSURE SKIN 1/2X4 (GAUZE/BANDAGES/DRESSINGS) ×2 IMPLANT
SUT MNCRL AB 4-0 PS2 18 (SUTURE) ×2 IMPLANT
SUT PDS AB 1 CT  36 (SUTURE) ×2
SUT PDS AB 1 CT 36 (SUTURE) IMPLANT
SUT PROLENE 1 CT (SUTURE) ×7 IMPLANT
SUT VICRYL 0 UR6 27IN ABS (SUTURE) IMPLANT
TACKER 5MM HERNIA 3.5CML NAB (ENDOMECHANICALS) IMPLANT
TOWEL OR 17X24 6PK STRL BLUE (TOWEL DISPOSABLE) ×2 IMPLANT
TOWEL OR 17X26 10 PK STRL BLUE (TOWEL DISPOSABLE) ×2 IMPLANT
TRAY FOLEY CATH 16FR SILVER (SET/KITS/TRAYS/PACK) IMPLANT
TRAY LAPAROSCOPIC MC (CUSTOM PROCEDURE TRAY) ×2 IMPLANT
TROCAR XCEL NON-BLD 11X100MML (ENDOMECHANICALS) ×2 IMPLANT
TROCAR XCEL NON-BLD 5MMX100MML (ENDOMECHANICALS) ×2 IMPLANT
TUBING INSUFFLATION (TUBING) ×2 IMPLANT

## 2014-12-26 NOTE — Progress Notes (Addendum)
Jessica Collier at [employer] called and informed of  surgery time. Jessica Collier states ok to tape magnet in place, but to call him if needed, that he is local. Dr Marcie Bal called and informed. ICD is located under her arm and gravity may be a problem.

## 2014-12-26 NOTE — Anesthesia Preprocedure Evaluation (Signed)
Anesthesia Evaluation  Patient identified by MRN, date of birth, ID band Patient awake    Reviewed: Allergy & Precautions, NPO status , Patient's Chart, lab work & pertinent test results  History of Anesthesia Complications (+) PONV and history of anesthetic complications  Airway Mallampati: II   Neck ROM: full    Dental   Pulmonary    breath sounds clear to auscultation       Cardiovascular + CAD and + Past MI  + Cardiac Defibrillator  Rhythm:regular Rate:Normal  MI s/p coronary vasospasm.  Pt has good exercise tolerance and recently walked a 5K without problem.   Neuro/Psych  Neuromuscular disease    GI/Hepatic   Endo/Other    Renal/GU      Musculoskeletal   Abdominal   Peds  Hematology   Anesthesia Other Findings   Reproductive/Obstetrics                             Anesthesia Physical Anesthesia Plan  ASA: III  Anesthesia Plan: General   Post-op Pain Management:    Induction: Intravenous  Airway Management Planned: Oral ETT  Additional Equipment:   Intra-op Plan:   Post-operative Plan: Extubation in OR  Informed Consent: I have reviewed the patients History and Physical, chart, labs and discussed the procedure including the risks, benefits and alternatives for the proposed anesthesia with the patient or authorized representative who has indicated his/her understanding and acceptance.     Plan Discussed with: CRNA, Anesthesiologist and Surgeon  Anesthesia Plan Comments:         Anesthesia Quick Evaluation

## 2014-12-26 NOTE — Interval H&P Note (Signed)
History and Physical Interval Note:  12/26/2014 12:42 PM  [name]  has presented today for surgery, with the diagnosis of Jessica Collier  The various methods of treatment have been discussed with the patient and family. After consideration of risks, benefits and other options for treatment, the patient has consented to  Procedure(s): [place] (N/A) INSERTION OF MESH (N/A) as a surgical intervention .  The patient's history has been reviewed, patient examined, no change in status, stable for surgery.  I have reviewed the patient's chart and labs.  Questions were answered to the patient's satisfaction.     Davyd Podgorski C.

## 2014-12-26 NOTE — Progress Notes (Signed)
  Paged Corene Cornea With [employer] to return call.

## 2014-12-26 NOTE — H&P (Signed)
Jessica Collier. [place] 11/06/2014 4:23 PM Location: [facility] Patient #: [id number] DOB: Mar 12, 1960 Married / Language: English / Race: White Female   History of Present Illness Patient words: umb hernia.  The patient is a 54 year old female who presents with an umbilical hernia. Patient sent for surgical consultation given concerned of increasingly symptomatic hernia near her belly button by her gynecologist, Dr. Louretta Shorten.  Pleasant active female. History of C-section through Pfannenstiel incision. Possibly diagnostic laparoscopy for endometriosis. Also laparoscopic cholecystectomy in 2010. Noticed a lump at her belly button a few years ago. Not really bothersome for most of that time, but more recently it is becoming more sore. She discussed with her gynecologist. Because it is becoming more symptomatic and is now stuck, surgical consultation requested. She is hoping to have it fixed before the end a year as her deductible is used up. She is normally rather active and can walk 45 minutes without difficulty. However she had episode of chest pain. 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 No problems since. Followed by Dr. Ellyn Hack with cardiology Dr. Virl Axe with electrophysiology. The patient and her husband tell me that her cardiologists are okay with her having surgery.  Cleared by cardiology  Other Problems Marjean Donna, [place]; 11/06/2014 4:23 PM) Cholelithiasis Congestive Heart Failure  Past Surgical History Marjean Donna, [place]; 11/06/2014 4:23 PM) Cesarean Section - Multiple Gallbladder Surgery - Laparoscopic  Diagnostic Studies History Marjean Donna, [place]; 11/06/2014 4:23 PM) Colonoscopy 1-5 years ago Mammogram within last year Pap Smear 1-5 years ago  Allergies Marjean Donna, [place]; 11/06/2014 4:24 PM) No Known Drug Allergies10/25/2016  Medication History Marjean Donna, CMA; 11/06/2014 4:25  PM) Zolpidem Tartrate ER (12.5MG  Tablet ER, Oral) Active. DiltiaZEM HCl ER Coated Beads (120MG  Capsule ER 24HR, Oral) Active. Isosorbide Mononitrate ER (30MG  Tablet ER 24HR, Oral) Active. Nitrostat (0.4MG  Tab Sublingual, Sublingual as needed) Active. Medications Reconciled  Social History Marjean Donna, CMA; 11/06/2014 4:23 PM) No alcohol use No caffeine use No drug use Tobacco use Never smoker.  Family History Marjean Donna, [place]; 11/06/2014 4:23 PM) Arthritis Father. Cervical Cancer Mother.  Pregnancy / Birth History Marjean Donna, [place]; 11/06/2014 4:23 PM) Age at menarche 52 years. Gravida 2 Maternal age 14-35 Para 3    Review of Systems ([place]; 11/06/2014 4:23 PM) General Not Present- Appetite Loss, Chills, Fatigue, Fever, Night Sweats, Weight Gain and Weight Loss. Skin Not Present- Change in Wart/Mole, Dryness, Hives, Jaundice, New Lesions, Non-Healing Wounds, Rash and Ulcer. HEENT Not Present- Earache, Hearing Loss, Hoarseness, Nose Bleed, Oral Ulcers, Ringing in the Ears, Seasonal Allergies, Sinus Pain, Sore Throat, Visual Disturbances, Wears glasses/contact lenses and Yellow Eyes. Respiratory Not Present- Bloody sputum, Chronic Cough, Difficulty Breathing, Snoring and Wheezing. Breast Not Present- Breast Mass, Breast Pain, Nipple Discharge and Skin Changes. Cardiovascular Not Present- Chest Pain, Difficulty Breathing Lying Down, Leg Cramps, Palpitations, Rapid Heart Rate, Shortness of Breath and Swelling of Extremities. Gastrointestinal Present- Nausea. Not Present- Abdominal Pain, Bloating, Bloody Stool, Change in Bowel Habits, Chronic diarrhea, Constipation, Difficulty Swallowing, Excessive gas, Gets full quickly at meals, Hemorrhoids, Indigestion, Rectal Pain and Vomiting. Female Genitourinary Present- Pelvic Pain. Not Present- Frequency, Nocturia, Painful Urination and Urgency. Musculoskeletal Not Present- Back Pain, Joint Pain, Joint Stiffness,  Muscle Pain, Muscle Weakness and Swelling of Extremities. Neurological Not Present- Decreased Memory, Fainting, Headaches, Numbness, Seizures, Tingling, Tremor, Trouble walking and Weakness. Psychiatric Not Present- Anxiety, Bipolar, Change in Sleep Pattern, Depression, Fearful and  Frequent crying. Endocrine Not Present- Cold Intolerance, Excessive Hunger, Hair Changes, Heat Intolerance, Hot flashes and New Diabetes. Hematology Not Present- Easy Bruising, Excessive bleeding, Gland problems, HIV and Persistent Infections.  Vitals (Sonya Bynum CMA; 11/06/2014 4:24 PM) 11/06/2014 4:23 PM Weight: 146 lb Height: 70in Body Surface Area: 1.83 m Body Mass Index: 20.95 kg/m  Temp.: 76F(Temporal)  Pulse: 76 (Regular)  BP: 118/76 (Sitting, Left Arm, Standard)       Physical Exam Adin Hector MD; 11/06/2014 5:11 PM) General Mental Status-Alert. General Appearance-Not in acute distress, Not Sickly. Orientation-Oriented X3. Hydration-Well hydrated. Voice-Normal. Note: Smiling affable   Integumentary Global Assessment Upon inspection and palpation of skin surfaces of the - Axillae: non-tender, no inflammation or ulceration, no drainage. and Distribution of scalp and body hair is normal. General Characteristics Temperature - normal warmth is noted.  Head and Neck Head-normocephalic, atraumatic with no lesions or palpable masses. Face Global Assessment - atraumatic, no absence of expression. Neck Global Assessment - no abnormal movements, no bruit auscultated on the right, no bruit auscultated on the left, no decreased range of motion, non-tender. Trachea-midline. Thyroid Gland Characteristics - non-tender.  Eye Eyeball - Left-Extraocular movements intact, No Nystagmus. Eyeball - Right-Extraocular movements intact, No Nystagmus. Cornea - Left-No Hazy. Cornea - Right-No Hazy. Sclera/Conjunctiva - Left-No scleral icterus, No  Discharge. Sclera/Conjunctiva - Right-No scleral icterus, No Discharge. Pupil - Left-Direct reaction to light normal. Pupil - Right-Direct reaction to light normal.  ENMT Ears Pinna - Left - no drainage observed, no generalized tenderness observed. Right - no drainage observed, no generalized tenderness observed. Nose and Sinuses External Inspection of the Nose - no destructive lesion observed. Inspection of the nares - Left - quiet respiration. Right - quiet respiration. Mouth and Throat Lips - Upper Lip - no fissures observed, no pallor noted. Lower Lip - no fissures observed, no pallor noted. Nasopharynx - no discharge present. Oral Cavity/Oropharynx - Tongue - no dryness observed. Oral Mucosa - no cyanosis observed. Hypopharynx - no evidence of airway distress observed.  Chest and Lung Exam Inspection Movements - Normal and Symmetrical. Accessory muscles - No use of accessory muscles in breathing. Palpation Palpation of the chest reveals - Non-tender. Auscultation Breath sounds - Normal and Clear.  Cardiovascular Auscultation Rhythm - Regular. Murmurs & Other Heart Sounds - Auscultation of the heart reveals - No Murmurs and No Systolic Clicks.  Abdomen Inspection Inspection of the abdomen reveals - No Visible peristalsis and No Abnormal pulsations. Umbilicus - No Bleeding, No Urine drainage. Palpation/Percussion Palpation and Percussion of the abdomen reveal - Soft, Non Tender, No Rebound tenderness, No Rigidity (guarding) and No Cutaneous hyperesthesia. Note: Thin and flat. Laparoscopic incisions well-healed. 2 x 1 cm umbilical mass consistent with incarcerated periumbilical hernia at old incisions.   Female Genitourinary Sexual Maturity Tanner 5 - Adult hair pattern. Note: No vaginal bleeding nor discharge   Peripheral Vascular Upper Extremity Inspection - Left - No Cyanotic nailbeds, Not Ischemic. Right - No Cyanotic nailbeds, Not  Ischemic.  Neurologic Neurologic evaluation reveals -normal attention span and ability to concentrate, able to name objects and repeat phrases. Appropriate fund of knowledge , normal sensation and normal coordination. Mental Status Affect - not angry, not paranoid. Cranial Nerves-Normal Bilaterally. Gait-Normal.  Neuropsychiatric Mental status exam performed with findings of-able to articulate well with normal speech/language, rate, volume and coherence, thought content normal with ability to perform basic computations and apply abstract reasoning and no evidence of hallucinations, delusions, obsessions or homicidal/suicidal ideation.  Musculoskeletal  Global Assessment Spine, Ribs and Pelvis - no instability, subluxation or laxity. Right Upper Extremity - no instability, subluxation or laxity.  Lymphatic Head & Neck  General Head & Neck Lymphatics: Bilateral - Description - No Localized lymphadenopathy. Axillary  General Axillary Region: Bilateral - Description - No Localized lymphadenopathy. Femoral & Inguinal  Generalized Femoral & Inguinal Lymphatics: Left - Description - No Localized lymphadenopathy. Right - Description - No Localized lymphadenopathy.    Assessment & Plan Adin Hector MD; 11/06/2014 5:10 PM) Jessica Collier HERNIA (K43.0) Impression: Small been sensitive incarcerated periumbilical hernia most likely related to incisions from prior diagnostic laparoscopy for endometriosis and laparoscopic cholecystectomy.  Toes it is incarcerated and symptomatic, think she would benefit from surgical repair at some point. Because it is recurrent from probably prior incisions, I wouldn't use mesh underlay repair to lower chance of recurrence given her young age and activity level. Would do a laparoscopic underlay with closure on top of it. Perhaps some increased pain in the short-term but less recurrence rate in the long-term.  The patient husband are  interested in proceeding. They assure me that her cardiologist and cleared her. I would still like a formal clearance to keep anesthesia assured Current Plans I recommended obtaining preoperative cardiac clearance. I am concerned about the health of the patient and the ability to tolerate the operation. Therefore, we will request clearance by cardiology to better assess operative risk & see if a reevaluation, further workup, etc is needed. Most likely will get clearance from Drs. Ellyn Hack and declined given her nonatherosclerotic spasm type disease. Also make sure that defibrillator not an issue. Also recommendations on how medications such as for anticoagulation and blood pressure should be managed/held/restarted after surgery. You are being scheduled for surgery - Our schedulers will call you.  You should hear from our office's scheduling department within 5 working days about the location, date, and time of surgery. We try to make accommodations for patient's preferences in scheduling surgery, but sometimes the OR schedule or the surgeon's schedule prevents [place] from making those accommodations.  If you have not heard from our office [phone]) in 5 working days, call the office and ask for your surgeon's nurse.  If you have other questions about your diagnosis, plan, or surgery, call the office and ask for your surgeon's nurse.  Written instructions provided Pt Education - Pamphlet Given - Laparoscopic Hernia Repair: discussed with patient and provided information. The anatomy & physiology of the abdominal wall was discussed. The pathophysiology of hernias was discussed. Natural history risks without surgery including progeressive enlargement, pain, incarceration, & strangulation was discussed. Contributors to complications such as smoking, obesity, diabetes, prior surgery, etc were discussed.  I feel the risks of no intervention will lead to serious problems that outweigh the operative risks;  therefore, I recommended surgery to reduce and repair the hernia. I explained laparoscopic techniques with possible need for an open approach. I noted the probable use of mesh to patch and/or buttress the hernia repair  Risks such as bleeding, infection, abscess, need for further treatment, heart attack, death, and other risks were discussed. I noted a good likelihood this will help address the problem. Goals of post-operative recovery were discussed as well. Possibility that this will not correct all symptoms was explained. I stressed the importance of low-impact activity, aggressive pain control, avoiding constipation, & not pushing through pain to minimize risk of post-operative chronic pain or injury. Possibility of reherniation especially with smoking, obesity, diabetes, immunosuppression, and other  health conditions was discussed. We will work to minimize complications.  An educational handout further explaining the pathology & treatment options was given as well. Questions were answered. The patient expresses understanding & wishes to proceed with surgery.  Pt Education - [facility] Hernia Post-Op HCI (Tava Peery): discussed with patient and provided information. Pt Education - [facility] Pain Control (Raydan Schlabach)  I have re-reviewed the the patient's records, history, medications, and allergies.  I have re-examined the patient.  I again discussed intraoperative plans and goals of post-operative recovery.  The patient agrees to proceed.   Adin Hector, M.D., F.A.C.S. Gastrointestinal and Minimally Invasive Surgery [facility] [address] [place], [place] [address] [phone] Main / Paging

## 2014-12-26 NOTE — Op Note (Signed)
12/26/2014  2:56 PM  PATIENT:  Jessica Collier  54 y.o. female  Patient Care Team: Louretta Shorten, MD as PCP - General (Obstetrics and Gynecology) Michael Boston, MD as Consulting Physician (General Surgery) Leonie Man, MD as Consulting Physician (Cardiology) Deboraha Sprang, MD as Consulting Physician (Cardiology)  PRE-OPERATIVE DIAGNOSIS:  INCISIONAL VENTRAL WALL ABDOMINAL WALL HERNIA REPAIR  POST-OPERATIVE DIAGNOSIS:    INCISIONAL VENTRAL WALL HERNIA REPAIR - periumbilical   PROCEDURE:   LAPAROSCOPIC REPAIR OF PERIUMBILICAL INCISIONAL HERNIA INSERTION OF MESH  SURGEON:  Surgeon(s): Michael Boston, MD  ASSISTANT: RN   ANESTHESIA:   local and general  EBL:  Total I/O In: 1000 [I.V.:1000] Out: -   Delay start of Pharmacological VTE agent (>24hrs) due to surgical blood loss or risk of bleeding:  no  DRAINS: none   SPECIMEN:  No Specimen  DISPOSITION OF SPECIMEN:  N/A  COUNTS:  YES  PLAN OF CARE: Discharge to home after PACU  PATIENT DISPOSITION:  PACU - hemodynamically stable.  INDICATION: Pleasant patient has developed a ventral wall abdominal hernia.   Recommendation was made for surgical repair:  The anatomy & physiology of the abdominal wall was discussed. The pathophysiology of hernias was discussed. Natural history risks without surgery including progeressive enlargement, pain, incarceration & strangulation was discussed. Contributors to complications such as smoking, obesity, diabetes, prior surgery, etc were discussed.  I feel the risks of no intervention will lead to serious problems that outweigh the operative risks; therefore, I recommended surgery to reduce and repair the hernia. I explained laparoscopic techniques with possible need for an open approach. I noted the probable use of mesh to patch and/or buttress the hernia repair  Risks such as bleeding, infection, abscess, need for further treatment, heart attack, death, and other risks were discussed. I  noted a good likelihood this will help address the problem. Goals of post-operative recovery were discussed as well. Possibility that this will not correct all symptoms was explained. I stressed the importance of low-impact activity, aggressive pain control, avoiding constipation, & not pushing through pain to minimize risk of post-operative chronic pain or injury. Possibility of reherniation especially with smoking, obesity, diabetes, immunosuppression, and other health conditions was discussed. We will work to minimize complications.  An educational handout further explaining the pathology & treatment options was given as well. Questions were answered. The patient expresses understanding & wishes to proceed with surgery.   OR FINDINGS: [id number] periumbilical hernia   Type of repair - Laparoscopic underlay repair   Name of mesh - Bard Ventralight dual sided (polypropylene / Seprafilm)  Size of mesh - Length 10 cm, Width 15 cm  Mesh overlap - 4-7 cm  Placement of mesh - Intraperitoneal underlay repair   DESCRIPTION:   Informed consent was confirmed. The patient underwent general anaesthesia without difficulty. The patient was positioned appropriately. VTE prevention in place. The patient's abdomen was clipped, prepped, & draped in a sterile fashion. Surgical timeout confirmed our plan.  The patient was positioned in reverse Trendelenburg. Abdominal entry was gained using optical entry technique in the left upper abdomen. Entry was clean. I induced carbon dioxide insufflation. Camera inspection revealed no injury. Extra ports were carefully placed under direct laparoscopic visualization.   I could see the hernia in the periumbilcal abdomen.  I freed the falciform ligament & preperitoneal fat off the periumbilical region to the subxiphoid region.   I made sure hemostasis was good.  I noted that there was only there periumbilical [id number] hernia.  I mapped out the region using a needle passer.   To  ensure that I would have at least 5 cm radial coverage outside of the hernia defect, I chose a 51x10 cm dual sided mesh.  I placed #1 Prolene stitches around its edge about every 5 cm = 10 total.  I rolled the mesh & placed into the peritoneal cavity through the 10 cm fascial defect.  I unrolled the mesh and positioned it appropriately.  I secured the mesh to cover up the hernia defect using a laparoscopic suture passer to pass the tails of the Prolene through the abdominal wall & tagged them with clamps.  I started out in four corners to make sure I had the mesh centered under the hernia defect appropriately, and then proceeded to work in quadrants.  We evacuated CO2 & desufflated the abdomen.  I closed the [place] primarily with interrupted #1 PDAS suture, incorporating a central part of the mesh x 2.   I tied the fascial stitches down.  I reinsufflated the abdomen.  The mesh provided at least 5-10 cm circumferential coverage around the entire region of hernia defects.   I tacked the edges & central part of the mesh to the peritoneum/posterior rectus fascia with SecureStrap absorbable tacks, bringing up the subxiphoid preperitoneal tissues under the superior edge of the mesh.    I did reinspection. Hemostasis was good. Mesh laid well. Capnoperitoneum was evacuated. Ports were removed. The skin was closed with Monocryl at the port sites and Steri-Strips on the fascial stitch puncture sites.   Patient is being extubated to go to the recovery room.I updated the status of the patient to the patient's spouse.  I made recommendations.  I answered questions.  Understanding & appreciation was expressed.  Adin Hector, M.D., F.A.C.S. Gastrointestinal and Minimally Invasive Surgery [facility] [address] [place], [place] [address] [phone] Main / Paging

## 2014-12-26 NOTE — Progress Notes (Signed)
Jessica Collier at [employer] to return call.

## 2014-12-26 NOTE — Discharge Instructions (Signed)
HERNIA REPAIR: POST OP INSTRUCTIONS ° °1. DIET: Follow a light bland diet the first 24 hours after arrival home, such as soup, liquids, crackers, etc.  Be sure to include lots of fluids daily.  Avoid fast food or heavy meals as your are more likely to get nauseated.  Eat a low fat the next few days after surgery. °2. Take your usually prescribed home medications unless otherwise directed. °3. PAIN CONTROL: °a. Pain is best controlled by a usual combination of three different methods TOGETHER: °i. Ice/Heat °ii. Over the counter pain medication °iii. Prescription pain medication °b. Most patients will experience some swelling and bruising around the hernia(s) such as the bellybutton, groins, or old incisions.  Ice packs or heating pads (30-60 minutes up to 6 times a day) will help. Use ice for the first few days to help decrease swelling and bruising, then switch to heat to help relax tight/sore spots and speed recovery.  Some people prefer to use ice alone, heat alone, alternating between ice & heat.  Experiment to what works for you.  Swelling and bruising can take several weeks to resolve.   °c. It is helpful to take an over-the-counter pain medication regularly for the first few weeks.  Choose one of the following that works best for you: °i. Naproxen (Aleve, etc)  Two 220mg tabs twice a day °ii. Ibuprofen (Advil, etc) Three 200mg tabs four times a day (every meal & bedtime) °iii. Acetaminophen (Tylenol, etc) 325-650mg four times a day (every meal & bedtime) °d. A  prescription for pain medication should be given to you upon discharge.  Take your pain medication as prescribed.  °i. If you are having problems/concerns with the prescription medicine (does not control pain, nausea, vomiting, rash, itching, etc), please call us [phone] to see if we need to switch you to a different pain medicine that will work better for you and/or control your side effect better. °ii. If you need a refill on your pain  medication, please contact your pharmacy.  They will contact our office to request authorization. Prescriptions will not be filled after 5 pm or on week-ends. °4. Avoid getting constipated.  Between the surgery and the pain medications, it is common to experience some constipation.  Increasing fluid intake and taking a fiber supplement (such as Metamucil, Citrucel, FiberCon, MiraLax, etc) 1-2 times a day regularly will usually help prevent this problem from occurring.  A mild laxative (prune juice, Milk of Magnesia, MiraLax, etc) should be taken according to package directions if there are no bowel movements after 48 hours.   °5. Wash / shower every day.  You may shower over the dressings as they are waterproof.   °6. Remove your waterproof bandages 5 days after surgery.  You may leave the incision open to air.  You may replace a dressing/Band-Aid to cover the incision for comfort if you wish.  Continue to shower over incision(s) after the dressing is off. ° ° ° °7. ACTIVITIES as tolerated:   °a. You may resume regular (light) daily activities beginning the next day--such as daily self-care, walking, climbing stairs--gradually increasing activities as tolerated.  If you can walk 30 minutes without difficulty, it is safe to try more intense activity such as jogging, treadmill, bicycling, low-impact aerobics, swimming, etc. °b. Save the most intensive and strenuous activity for last such as sit-ups, heavy lifting, contact sports, etc  Refrain from any heavy lifting or straining until you are off narcotics for pain control.   °  c. DO NOT PUSH THROUGH PAIN.  Let pain be your guide: If it hurts to do something, don't do it.  Pain is your body warning you to avoid that activity for another week until the pain goes down. d. You may drive when you are no longer taking prescription pain medication, you can comfortably wear a seatbelt, and you can safely maneuver your car and apply brakes. e. Dennis Bast may have sexual intercourse  when it is comfortable.  8. FOLLOW UP in our office a. Please call [facility] at [phone] to set up an appointment to see your surgeon in the office for a follow-up appointment approximately 2-3 weeks after your surgery. b. Make sure that you call for this appointment the day you arrive home to insure a convenient appointment time. 9.  IF YOU HAVE DISABILITY OR FAMILY LEAVE FORMS, BRING THEM TO THE OFFICE FOR PROCESSING.  DO NOT GIVE THEM TO YOUR DOCTOR.  WHEN TO CALL us [phone]: 1. Poor pain control 2. Reactions / problems with new medications (rash/itching, nausea, etc)  3. Fever over 101.5 F (38.5 C) 4. Inability to urinate 5. Nausea and/or vomiting 6. Worsening swelling or bruising 7. Continued bleeding from incision. 8. Increased pain, redness, or drainage from the incision   The clinic staff is available to answer your questions during regular business hours (8:30am-5pm).  Please dont hesitate to call and ask to speak to one of our nurses for clinical concerns.   If you have a medical emergency, go to the nearest emergency room or call 911.  A surgeon from [facility] is always on call at the hospitals in [facility], [place], [place], [place], [place]  [address] ?  [address] [address], [place], [place]   [address] MAIN: [phone] ? TOLL FREE: [phone] ? FAX: [phone] [email or web address]  Managing Pain  Pain after surgery or related to activity is often due to strain/injury to muscle, tendon, nerves and/or incisions.  This pain is usually short-term and will improve in a few months.   Many people find it helpful to do the following things TOGETHER to help speed the process of healing and to get back to regular activity more quickly:  1. Avoid heavy physical activity at first a. No lifting greater than 20 pounds at first, then increase to lifting as tolerated over the next few weeks b. Do not push  through the pain.  Listen to your body and avoid positions and maneuvers than reproduce the pain.  Wait a few days before trying something more intense c. Walking is okay as tolerated, but go slowly and stop when getting sore.  If you can walk 30 minutes without stopping or pain, you can try more intense activity (running, jogging, aerobics, cycling, swimming, treadmill, sex, sports, weightlifting, etc ) d. Remember: If it hurts to do it, then dont do it!  2. Take Acetaminophen Anti-inflammatory medication i. Acetaminophen 500mg  tabs (Tylenol) 1-2 pills with every meal and just before bedtime (avoid if you have liver problems) ii. Take with food/snack around the clock for 1-2 weeks iii. This helps the muscle and nerve tissues become less irritable and calm down faster  3. Use a Heating pad or Ice/Cold Pack a. 4-6 times a day b. May use warm bath/hottub  or showers  4. Try Gentle Massage and/or Stretching  a. at the area of pain many times a day b. stop if you feel pain - do not overdo  it  Try these steps together to help you body heal faster and avoid making things get worse.  Doing just one of these things may not be enough.    If you are not getting better after two weeks or are noticing you are getting worse, contact our office for further advice; we may need to re-evaluate you & see what other things we can do to help.  GETTING TO GOOD BOWEL HEALTH. Irregular bowel habits such as constipation and diarrhea can lead to many problems over time.  Having one soft bowel movement a day is the most important way to prevent further problems.  The anorectal canal is designed to handle stretching and feces to safely manage our ability to get rid of solid waste (feces, poop, stool) out of our body.  BUT, hard constipated stools can act like ripping concrete bricks and diarrhea can be a burning fire to this very sensitive area of our body, causing inflamed hemorrhoids, anal fissures, increasing risk  is perirectal abscesses, abdominal pain/bloating, an making irritable bowel worse.      The goal: ONE SOFT BOWEL MOVEMENT A DAY!  To have soft, regular bowel movements:   Drink plenty of fluids, consider 4-6 tall glasses of water a day.    Take plenty of fiber.  Fiber is the undigested part of plant food that passes into the colon, acting s natures broom to encourage bowel motility and movement.  Fiber can absorb and hold large amounts of water. This results in a larger, bulkier stool, which is soft and easier to pass. Work gradually over several weeks up to 6 servings a day of fiber (25g a day even more if needed) in the form of: o Vegetables -- Root (potatoes, carrots, turnips), leafy green (lettuce, salad greens, celery, spinach), or cooked high residue (cabbage, broccoli, etc) o Fruit -- Fresh (unpeeled skin & pulp), Dried (prunes, apricots, cherries, etc ),  or stewed ( applesauce)  o Whole grain breads, pasta, etc (whole wheat)  o Bran cereals   Bulking Agents -- This type of water-retaining fiber generally is easily obtained each day by one of the following:  o Psyllium bran -- The psyllium plant is remarkable because its ground seeds can retain so much water. This product is available as Metamucil, Konsyl, Effersyllium, Per Diem Fiber, or the less expensive generic preparation in drug and health food stores. Although labeled a laxative, it really is not a laxative.  o Methylcellulose -- This is another fiber derived from wood which also retains water. It is available as Citrucel. o Polyethylene Glycol - and artificial fiber commonly called Miralax or Glycolax.  It is helpful for people with gassy or bloated feelings with regular fiber o Flax Seed - a less gassy fiber than psyllium  No reading or other relaxing activity while on the toilet. If bowel movements take longer than 5 minutes, you are too constipated  AVOID CONSTIPATION.  High fiber and water intake usually takes care of  this.  Sometimes a laxative is needed to stimulate more frequent bowel movements, but   Laxatives are not a good long-term solution as it can wear the colon out.  They can help jump-start bowels if constipated, but should be relied on constantly without discussing with your doctor o Osmotics (Milk of Magnesia, Fleets phosphosoda, Magnesium citrate, MiraLax, GoLytely) are safer than  o Stimulants (Senokot, Castor Oil, Dulcolax, Ex Lax)    o Avoid taking laxatives for more than 7 days in a row.  IF SEVERELY CONSTIPATED, try a Bowel Retraining Program: o Do not use laxatives.  o Eat a diet high in roughage, such as bran cereals and leafy vegetables.  o Drink six (6) ounces of prune or apricot juice each morning.  o Eat two (2) large servings of stewed fruit each day.  o Take one (1) heaping tablespoon of a psyllium-based bulking agent twice a day. Use sugar-free sweetener when possible to avoid excessive calories.  o Eat a normal breakfast.  o Set aside 15 minutes after breakfast to sit on the toilet, but do not strain to have a bowel movement.  o If you do not have a bowel movement by the third day, use an enema and repeat the above steps.   Controlling diarrhea o Switch to liquids and simpler foods for a few days to avoid stressing your intestines further. o Avoid dairy products (especially milk & ice cream) for a short time.  The intestines often can lose the ability to digest lactose when stressed. o Avoid foods that cause gassiness or bloating.  Typical foods include beans and other legumes, cabbage, broccoli, and dairy foods.  Every person has some sensitivity to other foods, so listen to our body and avoid those foods that trigger problems for you. o Adding fiber (Citrucel, Metamucil, psyllium, Miralax) gradually can help thicken stools by absorbing excess fluid and retrain the intestines to act more normally.  Slowly increase the dose over a few weeks.  Too much fiber too soon can backfire  and cause cramping & bloating. o Probiotics (such as active yogurt, Align, etc) may help repopulate the intestines and colon with normal bacteria and calm down a sensitive digestive tract.  Most studies show it to be of mild help, though, and such products can be costly. o Medicines: - Bismuth subsalicylate (ex. Kayopectate, Pepto Bismol) every 30 minutes for up to 6 doses can help control diarrhea.  Avoid if pregnant. - Loperamide (Immodium) can slow down diarrhea.  Start with two tablets (4mg  total) first and then try one tablet every 6 hours.  Avoid if you are having fevers or severe pain.  If you are not better or start feeling worse, stop all medicines and call your doctor for advice o Call your doctor if you are getting worse or not better.  Sometimes further testing (cultures, endoscopy, X-ray studies, bloodwork, etc) may be needed to help diagnose and treat the cause of the diarrhea.  TROUBLESHOOTING IRREGULAR BOWELS 1) Avoid extremes of bowel movements (no bad constipation/diarrhea) 2) Miralax 17gm mixed in 8oz. water or juice-daily. May use BID as needed.  3) Gas-x,Phazyme, etc. as needed for gas & bloating.  4) Soft,bland diet. No spicy,greasy,fried foods.  5) Prilosec over-the-counter as needed  6) May hold gluten/wheat products from diet to see if symptoms improve.  7)  May try probiotics (Align, Activa, etc) to help calm the bowels down 7) If symptoms become worse call back immediately.  Hernia, Adult A hernia is the bulging of an organ or tissue through a weak spot in the muscles of the abdomen (abdominal wall). Hernias develop most often near the navel or groin. There are many kinds of hernias. Common kinds include:  Femoral hernia. This kind of hernia develops under the groin in the upper thigh area.  Inguinal hernia. This kind of hernia develops in the groin or scrotum.  Umbilical hernia. This kind of hernia develops near the navel.  Hiatal hernia. This kind of hernia  causes part of the  stomach to be pushed up into the chest.  Incisional hernia. This kind of hernia bulges through a scar from an abdominal surgery. CAUSES This condition may be caused by:  Heavy lifting.  Coughing over a long period of time.  Straining to have a bowel movement.  An incision made during an abdominal surgery.  A birth defect (congenital defect).  Excess weight or obesity.  Smoking.  Poor nutrition.  Cystic fibrosis.  Excess fluid in the abdomen.  Undescended testicles. SYMPTOMS Symptoms of a hernia include:  A lump on the abdomen. This is the first sign of a hernia. The lump may become more obvious with standing, straining, or coughing. It may get bigger over time if it is not treated or if the condition causing it is not treated.  Pain. A hernia is usually painless, but it may become painful over time if treatment is delayed. The pain is usually dull and may get worse with standing or lifting heavy objects. Sometimes a hernia gets tightly squeezed in the weak spot (strangulated) or stuck there (incarcerated) and causes additional symptoms. These symptoms may include:  Vomiting.  Nausea.  Constipation.  Irritability. DIAGNOSIS A hernia may be diagnosed with:  A physical exam. During the exam your health care provider may ask you to cough or to make a specific movement, because a hernia is usually more visible when you move.  Imaging tests. These can include:  X-rays.  Ultrasound.  CT scan. TREATMENT A hernia that is small and painless may not need to be treated. A hernia that is large or painful may be treated with surgery. Inguinal hernias may be treated with surgery to prevent incarceration or strangulation. Strangulated hernias are always treated with surgery, because lack of blood to the trapped organ or tissue can cause it to die. Surgery to treat a hernia involves pushing the bulge back into place and repairing the weak part of the  abdomen. HOME CARE INSTRUCTIONS  Avoid straining.  Do not lift anything heavier than 10 lb (4.5 kg).  Lift with your leg muscles, not your back muscles. This helps avoid strain.  When coughing, try to cough gently.  Prevent constipation. Constipation leads to straining with bowel movements, which can make a hernia worse or cause a hernia repair to break down. You can prevent constipation by:  Eating a high-fiber diet that includes plenty of fruits and vegetables.  Drinking enough fluids to keep your urine clear or pale yellow. Aim to drink 6-8 glasses of water per day.  Using a stool softener as directed by your health care provider.  Lose weight, if you are overweight.  Do not use any tobacco products, including cigarettes, chewing tobacco, or electronic cigarettes. If you need help quitting, ask your health care provider.  Keep all follow-up visits as directed by your health care provider. This is important. Your health care provider may need to monitor your condition. SEEK MEDICAL CARE IF:  You have swelling, redness, and pain in the affected area.  Your bowel habits change. SEEK IMMEDIATE MEDICAL CARE IF:  You have a fever.  You have abdominal pain that is getting worse.  You feel nauseous or you vomit.  You cannot push the hernia back in place by gently pressing on it while you are lying down.  The hernia:  Changes in shape or size.  Is stuck outside the abdomen.  Becomes discolored.  Feels hard or tender.   This information is not intended to replace advice given to  you by your health care provider. Make sure you discuss any questions you have with your health care provider.   Document Released: 12/29/2004 Document Revised: 01/19/2014 Document Reviewed: 11/08/2013 Elsevier Interactive Patient Education [employer].

## 2014-12-26 NOTE — Progress Notes (Signed)
Corene Cornea returned call and states he can be here in 15 mins. CRNA Gerald Stabs informed.

## 2014-12-26 NOTE — Anesthesia Procedure Notes (Signed)
Procedure Name: Intubation Performed by: Judeth Cornfield T Pre-anesthesia Checklist: Patient identified, Timeout performed, Emergency Drugs available, Suction available and Patient being monitored Patient Re-evaluated:Patient Re-evaluated prior to inductionOxygen Delivery Method: Circle system utilized Preoxygenation: Pre-oxygenation with 100% oxygen Intubation Type: IV induction Ventilation: Mask ventilation without difficulty Laryngoscope Size: Mac and 3 Grade View: Grade II Tube type: Oral Tube size: 7.0 mm Number of attempts: 1 Airway Equipment and Method: Stylet Placement Confirmation: ETT inserted through vocal cords under direct vision,  breath sounds checked- equal and bilateral and positive ETCO2 Secured at: 21 cm Tube secured with: Tape

## 2014-12-26 NOTE — Transfer of Care (Signed)
Immediate Anesthesia Transfer of Care Note  Patient: Jessica Collier  Procedure(s) Performed: Procedure(s): LAPAROSCOPIC REPAIR OF PERIUMBILICAL INCISIONAL HERNIA (N/A) INSERTION OF MESH (N/A)  Patient Location: PACU  Anesthesia Type:General  Level of Consciousness: awake, patient cooperative and responds to stimulation  Airway & Oxygen Therapy: Patient Spontanous Breathing and Patient connected to nasal cannula oxygen  Post-op Assessment: Report given to RN and Post -op Vital signs reviewed and stable  Post vital signs: Reviewed and stable  Last Vitals:  Filed Vitals:   12/26/14 1147 12/26/14 1500  BP: 129/73   Pulse: 75   Temp: 36.9 C 36.8 C  Resp: 20     Complications: No apparent anesthesia complications

## 2014-12-27 ENCOUNTER — Encounter (HOSPITAL_COMMUNITY): Payer: Self-pay | Admitting: Surgery

## 2014-12-27 NOTE — Anesthesia Postprocedure Evaluation (Signed)
Anesthesia Post Note  Patient: Jessica Collier  Procedure(s) Performed: Procedure(s) (LRB): LAPAROSCOPIC REPAIR OF PERIUMBILICAL INCISIONAL HERNIA (N/A) INSERTION OF MESH (N/A)  Patient location during evaluation: PACU Anesthesia Type: General Level of consciousness: awake and alert Pain management: pain level controlled Vital Signs Assessment: post-procedure vital signs reviewed and stable Respiratory status: spontaneous breathing, nonlabored ventilation and respiratory function stable Cardiovascular status: blood pressure returned to baseline and stable Postop Assessment: no signs of nausea or vomiting Anesthetic complications: no    Last Vitals:  Filed Vitals:   12/26/14 1645 12/26/14 1649  BP:  109/63  Pulse: 72 78  Temp:    Resp: 14 16    Last Pain:  Filed Vitals:   12/26/14 1729  PainSc: 4                  Taqwa Deem,W. EDMOND

## 2015-02-27 ENCOUNTER — Other Ambulatory Visit: Payer: Self-pay | Admitting: Cardiovascular Disease

## 2015-02-28 NOTE — Telephone Encounter (Signed)
Rx request sent to pharmacy.  

## 2015-03-02 ENCOUNTER — Telehealth: Payer: Self-pay | Admitting: Nurse Practitioner

## 2015-03-02 ENCOUNTER — Encounter: Payer: Self-pay | Admitting: Internal Medicine

## 2015-03-02 NOTE — Telephone Encounter (Signed)
   Pts husband called this afternoon.  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  Per husband, who is not with his wife right now, she was not having cp, sob, palps, presyncope, or syncope prior to shocks.  I rec that she be seen today.  As she is in [place], she can be eval @ [facility] for interrogation, treatment/device adjustment if necessary.  Husband verbalized understanding.  I instructed that she is not to drive.  Murray Hodgkins, NP 03/02/2015, 3:42 PM

## 2015-03-05 ENCOUNTER — Encounter: Payer: Self-pay | Admitting: Internal Medicine

## 2015-03-05 ENCOUNTER — Encounter: Payer: BC Managed Care – PPO | Admitting: Internal Medicine

## 2015-03-05 ENCOUNTER — Ambulatory Visit (INDEPENDENT_AMBULATORY_CARE_PROVIDER_SITE_OTHER): Payer: BC Managed Care – PPO | Admitting: Internal Medicine

## 2015-03-05 VITALS — BP 112/80 | HR 71 | Ht 70.0 in | Wt 143.4 lb

## 2015-03-05 DIAGNOSIS — I201 Angina pectoris with documented spasm: Secondary | ICD-10-CM

## 2015-03-05 DIAGNOSIS — T82198A Other mechanical complication of other cardiac electronic device, initial encounter: Secondary | ICD-10-CM

## 2015-03-05 DIAGNOSIS — Z95810 Presence of automatic (implantable) cardiac defibrillator: Secondary | ICD-10-CM | POA: Diagnosis not present

## 2015-03-05 DIAGNOSIS — I4901 Ventricular fibrillation: Secondary | ICD-10-CM | POA: Diagnosis not present

## 2015-03-05 DIAGNOSIS — R42 Dizziness and giddiness: Secondary | ICD-10-CM | POA: Diagnosis not present

## 2015-03-05 HISTORY — DX: Other mechanical complication of other cardiac electronic device, initial encounter: T82.198A

## 2015-03-05 NOTE — Patient Instructions (Signed)
Medication Instructions: - Your physician recommends that you continue on your current medications as directed. Please refer to the Current Medication list given to you today.  Labwork: - none  Procedures/Testing: - none  Follow-Up: - Your physician wants you to follow-up in: August 2017 with Dr. Caryl Comes. You will receive a reminder letter in the mail two months in advance. If you don't receive a letter, please call our office to schedule the follow-up appointment.  Any Additional Special Instructions Will Be Listed Below (If Applicable). - monitor your heart rates with walking- we will call you back on Thursday to follow up    If you need a refill on your cardiac medications before your next appointment, please call your pharmacy.

## 2015-03-05 NOTE — Progress Notes (Signed)
Patient Care Team: Louretta Shorten, MD as PCP - General (Obstetrics and Gynecology) Michael Boston, MD as Consulting Physician (General Surgery) Leonie Man, MD as Consulting Physician (Cardiology) Deboraha Sprang, MD as Consulting Physician (Cardiology)   HPI  Jessica Collier is a 55 y.o. female Seen in follow-up for cardiac arrest related to inferior wall spasm. MRI scanning had demonstrated scarring suggesting prior episodes of spasm as well. She underwent ICD implantation for secondary prevention--subcutaneous  She suffered inappropriate ICD shocks over the weekend. One was followed 10 minutes later. The latter was associated with T wave ultimately as. Both were associated with triple counting p waves as  As Well As T Waves.  Otherwise she has been fine  Recent change in the formulation of her isosorbide was associated with increasing problems with orthostatic lightheadedness. Her volume status is somewhat deplete.  Past Medical History  Diagnosis Date  . Cardiac arrest ([place]) 07/31/2014    Related to spasm induced inferior STEMI  . 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  Cardiac MRI confirms area of infarction in the territory of the right coronary artery.   . 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  Marland Kitchen AICD (automatic cardioverter/defibrillator) present 08/04/2014    [facility] scientific device implanted subcutaneously because of ventricular fibrillation occurring during the setting of coronary spasm   . PONV (postoperative nausea and vomiting)     Past Surgical History  Procedure Laterality Date  . Cholecystectomy    . Cesarean section      X 2  . Cardiac catheterization N/A 07/31/2014    Procedure: Left Heart Cath and Coronary  Angiography;  Surgeon: Troy Sine, MD;  Location: [facility];  Service: Cardiovascular;  normal coronary arteries. Normal EF  . Ep implantable device N/A 08/03/2014    Procedure: SubQ ICD Implant;  Surgeon: Deboraha Sprang, MD;  Location: [facility] [facility];  Service: Cardiovascular;  Laterality: N/A;  . Transthoracic echocardiogram  08/01/2014     Normal wall motion. Normal diastolic function. Trivial MR  . Cardiac mri  08/01/2014    EF 58%. Apical inferior and septal akinesis and mild hypokinesis of basal inferior and inferolateral walls; late gadolinium enhancement of mid inferior, apical inferior and septal walls involving true apex and RV apex consistent with transmural infarction in RCA/PDA territory with RV involvement. RVEF 41% = mildly decreased with dyskinesis of the apical portion.  . Colonoscopy    . Endometrial ablation    . Ventral hernia repair N/A 12/26/2014    Procedure: LAPAROSCOPIC REPAIR OF PERIUMBILICAL INCISIONAL HERNIA;  Surgeon: Michael Boston, MD;  Location: [facility];  Service: General;  Laterality: N/A;  . Insertion of mesh N/A 12/26/2014    Procedure: INSERTION OF MESH;  Surgeon: Michael Boston, MD;  Location: [facility];  Service: General;  Laterality: N/A;    Current Outpatient Prescriptions  Medication Sig Dispense Refill  . diltiazem (CARDIZEM CD) 120 MG 24 hr capsule TAKE 1 CAPSULE (120 MG TOTAL) BY MOUTH AT BEDTIME. 30 capsule 1  . isosorbide mononitrate (IMDUR) 30 MG 24 hr tablet TAKE 1 TABLET (30 MG TOTAL) BY MOUTH DAILY. 30 tablet 1  . isosorbide mononitrate (IMDUR) 30 MG 24 hr tablet Take 30 mg by mouth daily.  Take one-half tablet (15mg ) daily    . nitroGLYCERIN (NITROSTAT) 0.4 MG SL tablet Place 1 tablet (0.4 mg total) under the tongue every 5 (five) minutes as needed for chest pain. 25 tablet 6   No current facility-administered medications for this visit.    No Known Allergies    Review of Systems negative except from HPI and PMH  Physical Exam BP  112/80 mmHg  Pulse 71  Ht 5\' 10"  (1.778 m)  Wt 143 lb 6.4 oz (65.046 kg)  BMI 20.58 kg/m2 Well developed and well nourished in no acute distress HENT normal E scleral and icterus clear Neck Supple JVP flat; carotids brisk and full Clear to ausculation Device pocket well healed; without hematoma or erythema.  There is no tethering *Regular rate and rhythm, no murmurs gallops or rub Soft with active bowel sounds No clubbing cyanosis  Edema Alert and oriented, grossly normal motor and sensory function Skin Warm and Dry  ECG demonstrates sinus rhythm at 69 intervals 17/08/41 Axis XLIV  Assessment and  Plan  Coronary spasm  Cardiac arrest associated with the above  Myocardial infarction associated with the above  Orthostatic lightheadedness  ICD-S  Inappropriate shock     There have been a lot of discussions regarding the inappropriate shock therapy.  The secondary jugular runs along her sternum. May be responsible for Y P waves were sensed. Also, variable amplitude P waves T waves and QRS was approaching [place]  I don't know why her heart rate 150 BPM just moving suitcases This may be her normal. We will have her track her activity related heart rates. The event that her activity related heart rates are not in this range it would suggest a possible atrial tachycardia. This might prompt the need for alternative medications. Given her spasm like to avoid beta blockers.  Reprogrammed vector to Primary  More than 50% of 45 min was spent in counseling related to the above

## 2015-03-06 LAB — CUP PACEART INCLINIC DEVICE CHECK
Implantable Lead Implant Date: 20160722
Implantable Lead Location: 753862
Implantable Lead Model: 3401
MDC IDC PG SERIAL: 112653
MDC IDC SESS DTM: 20170222092410

## 2015-03-07 ENCOUNTER — Telehealth: Payer: Self-pay | Admitting: Internal Medicine

## 2015-03-07 DIAGNOSIS — R000 Tachycardia, unspecified: Secondary | ICD-10-CM

## 2015-03-07 NOTE — Telephone Encounter (Signed)
Reviewed with Dr. Caryl Comes- he would like her to have a GXT in about 2 weeks as her HR's are still somewhat elevated. He wants to let her know that her labs and chest X-ray at [facility] were also normal.

## 2015-03-07 NOTE — Telephone Encounter (Signed)
I called and spoke with the patient to get an update from her on her HR's with exertion over the last 24 hours. Per the patient's report- she was 97 bpm yesterday- today she has been 82 bpm & 104 bpm with activity. I advised her I will review with Dr. Caryl Comes and call back with any recommendations.

## 2015-03-11 NOTE — Telephone Encounter (Signed)
I spoke with patient.  She is aware of Dr. Olin Pia recommendations for a GXT in ~ 2 weeks. I advised I will forward to scheduling to call and arrange for her. She is agreeable.

## 2015-03-11 NOTE — Telephone Encounter (Signed)
Returning your call. °

## 2015-03-11 NOTE — Telephone Encounter (Signed)
I left a message for the patient to call. 

## 2015-03-26 ENCOUNTER — Ambulatory Visit (INDEPENDENT_AMBULATORY_CARE_PROVIDER_SITE_OTHER): Payer: BC Managed Care – PPO | Admitting: Internal Medicine

## 2015-03-26 ENCOUNTER — Encounter: Payer: Self-pay | Admitting: Internal Medicine

## 2015-03-26 ENCOUNTER — Ambulatory Visit (INDEPENDENT_AMBULATORY_CARE_PROVIDER_SITE_OTHER): Payer: BC Managed Care – PPO

## 2015-03-26 DIAGNOSIS — T82198A Other mechanical complication of other cardiac electronic device, initial encounter: Secondary | ICD-10-CM | POA: Diagnosis not present

## 2015-03-26 DIAGNOSIS — R000 Tachycardia, unspecified: Secondary | ICD-10-CM

## 2015-03-26 DIAGNOSIS — Z95810 Presence of automatic (implantable) cardiac defibrillator: Secondary | ICD-10-CM

## 2015-03-26 NOTE — Patient Instructions (Signed)
Medication Instructions:  Your physician recommends that you continue on your current medications as directed. Please refer to the Current Medication list given to you today.  Labwork: None ordered  Testing/Procedures: None ordered  Follow-Up: Your physician recommends that you schedule a follow-up appointment in: July with Dr. Caryl Comes.  If you need a refill on your cardiac medications before your next appointment, please call your pharmacy.  Thank you for choosing [facility]!!

## 2015-03-26 NOTE — Progress Notes (Signed)
Review the findings of the treadmill with Jessica Collier. There was some oversensing in the primary vector of T waves associated with PVCs. The stress test was repeated in the alternate vector; no oversensing was noted. We have reprogrammed the device to the alternative vector  We spent more than 50% of our >25 min visit in face to face counseling regarding the above

## 2015-04-04 ENCOUNTER — Telehealth: Payer: Self-pay | Admitting: Internal Medicine

## 2015-04-04 ENCOUNTER — Ambulatory Visit (INDEPENDENT_AMBULATORY_CARE_PROVIDER_SITE_OTHER): Payer: BC Managed Care – PPO | Admitting: *Deleted

## 2015-04-04 DIAGNOSIS — Z95810 Presence of automatic (implantable) cardiac defibrillator: Secondary | ICD-10-CM

## 2015-04-04 NOTE — Telephone Encounter (Signed)
Put on device clinic schedule at 3:30 today while Dr. Caryl Comes is in the office- pt agreeable.

## 2015-04-04 NOTE — Telephone Encounter (Signed)
New Message  Pt called states that she has a rash that looks like a ringworm that is huge. It has the dot in the middle like a nickel and the entire rash is the size of a silver dollar. It doesn't itch its just dry and its on top of the defibrillator. She just noticed it this morning.

## 2015-04-04 NOTE — Progress Notes (Signed)
Patient presents with a circular rash over SICD on left posterior thorax. Upon evaluation it appears as if an electrode had been placed there- the patient reports that she had a stress test last Tuesday and an electrode was in fact placed there. I attempted to remove any residue with a "universal remover wipe" to no avail. Dr. Caryl Comes to room to evaluate to be sure- he advised the patient to use OTC hydrocortisone cream to alleviate itching. SICD pocket without erythema, edema or drainage. Patient instructed to call back if these symptoms were noted. She and her husband are agreeable.

## 2015-04-16 ENCOUNTER — Ambulatory Visit: Payer: BC Managed Care – PPO | Admitting: Internal Medicine

## 2015-04-28 ENCOUNTER — Other Ambulatory Visit: Payer: Self-pay | Admitting: Cardiology

## 2015-04-29 ENCOUNTER — Encounter: Payer: Self-pay | Admitting: Internal Medicine

## 2015-04-29 NOTE — Telephone Encounter (Signed)
Rx(s) sent to pharmacy electronically.  

## 2015-06-04 ENCOUNTER — Ambulatory Visit (INDEPENDENT_AMBULATORY_CARE_PROVIDER_SITE_OTHER): Payer: BC Managed Care – PPO | Admitting: *Deleted

## 2015-06-04 ENCOUNTER — Telehealth: Payer: Self-pay | Admitting: Cardiology

## 2015-06-04 DIAGNOSIS — Z95810 Presence of automatic (implantable) cardiac defibrillator: Secondary | ICD-10-CM

## 2015-06-04 DIAGNOSIS — I4901 Ventricular fibrillation: Secondary | ICD-10-CM

## 2015-06-04 NOTE — Telephone Encounter (Signed)
Spoke with pt and reminded pt of remote transmission that is due today. Pt verbalized understanding.   

## 2015-06-05 NOTE — Progress Notes (Signed)
Remote ICD transmission.   

## 2015-06-13 ENCOUNTER — Encounter: Payer: Self-pay | Admitting: Cardiology

## 2015-06-13 ENCOUNTER — Ambulatory Visit (INDEPENDENT_AMBULATORY_CARE_PROVIDER_SITE_OTHER): Payer: BC Managed Care – PPO | Admitting: Cardiology

## 2015-06-13 VITALS — BP 110/50 | HR 80 | Ht 70.0 in | Wt 145.0 lb

## 2015-06-13 DIAGNOSIS — I4901 Ventricular fibrillation: Secondary | ICD-10-CM | POA: Diagnosis not present

## 2015-06-13 DIAGNOSIS — G4700 Insomnia, unspecified: Secondary | ICD-10-CM

## 2015-06-13 DIAGNOSIS — I201 Angina pectoris with documented spasm: Secondary | ICD-10-CM

## 2015-06-13 DIAGNOSIS — I2119 ST elevation (STEMI) myocardial infarction involving other coronary artery of inferior wall: Secondary | ICD-10-CM

## 2015-06-13 DIAGNOSIS — Z95810 Presence of automatic (implantable) cardiac defibrillator: Secondary | ICD-10-CM

## 2015-06-13 MED ORDER — DILTIAZEM HCL ER COATED BEADS 120 MG PO CP24: 120.0000 mg | ORAL_CAPSULE | Freq: Every day | ORAL | Status: DC

## 2015-06-13 MED ORDER — NITROGLYCERIN 0.4 MG SL SUBL: 0.4000 mg | SUBLINGUAL_TABLET | SUBLINGUAL | Status: DC | PRN

## 2015-06-13 MED ORDER — ZOLPIDEM TARTRATE 5 MG PO TABS: 5.0000 mg | ORAL_TABLET | Freq: Every evening | ORAL | Status: DC | PRN

## 2015-06-13 MED ORDER — ISOSORBIDE MONONITRATE ER 30 MG PO TB24: 30.0000 mg | ORAL_TABLET | Freq: Every day | ORAL | Status: DC

## 2015-06-13 NOTE — Progress Notes (Signed)
PCP: Luz Lex, MD  Clinic Note: Chief Complaint  Patient presents with  . Follow-up    Coronary artery spasm caused MI    HPI: Jessica Collier is a 55 y.o. female with a PMH below who presents today for delayed six-month follow-up for her history of coronary spasm induced ST elevation MI back in July 2016.Marland Kitchen She actually was admitted on the 19th through July 23 initially with ventricular fibrillation in the setting of inferior STEMI. She had CPR defibrillation. Cardiac catheterization revealed essentially normal coronary arteries with likely spasm. EF was normal. Cardiac MRI actually showed akinesis of the apical inferior and septal walls and dyskinesis of the apical portion of the RV free wall. There are findings consistent with transmural infarction of the RCA/PDA territory with RV involvement. A son findings, this looked to be a subacute finding in not acute. As result of the concerning findings of infarction from coronary spasm, the decision was made to prophylactically place a subcutaneous ICD.  I last saw her back in September 2016. She was doing pretty well with this and occasional cough irritating her ribs from her CPR related rib injury. Otherwise just a bit anxious but fairing relatively well. No medication changes were made to simply gave her prescription for when necessary nitroglycerin.  Jessica Collier was last seen on 03/26/2015 by Dr. Jolyn Nap. This was following a treadmill stress test that showed some over sensing of the primary vector T waves associated with PVCs. Her device was reprogrammed. This was for inappropriate shocks.  Recent Hospitalizations: None  Studies Reviewed:   GXT. Normal blood pressure response to exercise. Exaggerated heart rate response. No ST segment changes. PVCs noted to be over sensed in the primary vector.  Interval History: Jessica Collier doing quite well. She's not had any further anginal type symptoms with rest or exertion. She's had a couple  tines were she's felt Panicky. This all started since she had some inappropriate shocks. She's felt some twinges in her chest but nothing lasting more than a few seconds. No rapid irregular heartbeats palpitations. No culprit/near syncope. No heart failure symptoms of PND, orthopnea or edema. No resting or exertional chest tightness/pressure or dyspnea.  No syncope/near syncope or TIA/amaurosis fugax symptoms. No melena, hematochezia, hematuria, or epstaxis. No claudication.  ROS: A comprehensive was performed. Review of Systems  Constitutional: Negative for malaise/fatigue.  HENT: Negative for nosebleeds.   Respiratory: Negative for cough, shortness of breath and wheezing.   Cardiovascular: Positive for palpitations (Fleeting - rare).  Gastrointestinal: Negative for heartburn, blood in stool and melena.  Genitourinary: Negative for hematuria.  Musculoskeletal: Negative for myalgias, back pain and joint pain.  Neurological: Negative for dizziness and headaches.  Endo/Heme/Allergies: Does not bruise/bleed easily.  Psychiatric/Behavioral: The patient has insomnia (Really having are time sleeping she is able to go to sleep, but then will wake up after couple hours and cannot go back to sleep.).   All other systems reviewed and are negative.    Past Medical History  Diagnosis Date  . Cardiac arrest ([place]) 07/31/2014    Related to spasm induced inferior STEMI  . 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  Cardiac MRI confirms area of infarction in the territory of the right coronary artery.   . 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  Marland Kitchen AICD (automatic cardioverter/defibrillator) present 08/04/2014    [facility] scientific device implanted subcutaneously  because of ventricular fibrillation occurring during the setting of coronary spasm   . PONV (postoperative nausea and vomiting)   . Inappropriate shocks from ICD (implantable cardioverter-defibrillator) 03/05/2015    Past Surgical History  Procedure Laterality Date  . Cholecystectomy    . Cesarean section      X 2  . Cardiac catheterization N/A 07/31/2014    Procedure: Left Heart Cath and Coronary Angiography;  Surgeon: Troy Sine, MD;  Location: [place];  Service: Cardiovascular;  normal coronary arteries. Normal EF  . Ep implantable device N/A 08/03/2014    Procedure: SubQ ICD Implant;  Surgeon: Deboraha Sprang, MD;  Location: [facility];  Service: Cardiovascular;  Laterality: N/A;  . Transthoracic echocardiogram  08/01/2014     Normal wall motion. Normal diastolic function. Trivial MR  . Cardiac mri  08/01/2014    EF 58%. Apical inferior and septal akinesis and mild hypokinesis of basal inferior and inferolateral walls; late gadolinium enhancement of mid inferior, apical inferior and septal walls involving true apex and RV apex consistent with transmural infarction in RCA/PDA territory with RV involvement. RVEF 41% = mildly decreased with dyskinesis of the apical portion.  . Colonoscopy    . Endometrial ablation    . Ventral hernia repair N/A 12/26/2014    Procedure: LAPAROSCOPIC REPAIR OF PERIUMBILICAL INCISIONAL HERNIA;  Surgeon: Michael Boston, MD;  Location: [facility];  Service: General;  Laterality: N/A;  . Insertion of mesh N/A 12/26/2014    Procedure: INSERTION OF MESH;  Surgeon: Michael Boston, MD;  Location: [facility];  Service: General;  Laterality: N/A;    Prior to Admission medications   Medication Sig Start Date End Date Taking? Authorizing Provider  diltiazem (CARDIZEM CD) 120 MG 24 hr capsule Take 1 capsule (120 mg total) by mouth daily. <PLEASE MAKE APPOINTMENT FOR REFILLS> 04/29/15   Leonie Man, MD  isosorbide mononitrate (IMDUR) 30 MG 24 hr tablet Take 30  mg by mouth daily. Take one-half tablet (15mg ) daily    Historical Provider, MD  isosorbide mononitrate (IMDUR) 30 MG 24 hr tablet Take 1 tablet (30 mg total) by mouth daily. <PLEASE MAKE APPOINTMENT FOR REFILLS> 04/29/15   Leonie Man, MD  nitroGLYCERIN (NITROSTAT) 0.4 MG SL tablet Place 1 tablet (0.4 mg total) under the tongue every 5 (five) minutes as needed for chest pain. 10/08/14   Leonie Man, MD    No Known Allergies   Social History   Social History  . Marital Status: Married    Spouse Name: N/A  . Number of Children: N/A  . Years of Education: N/A   Social History Main Topics  . Smoking status: Never Smoker   . Smokeless tobacco: Never Used  . Alcohol Use: No  . Drug Use: No  . Sexual Activity: Yes   Other Topics Concern  . None   Social History Narrative   family history includes Hypertension in her father.   Wt Readings from Last 3 Encounters:  06/13/15 145 lb (65.772 kg)  03/05/15 143 lb 6.4 oz (65.046 kg)  12/26/14 146 lb 8 oz (66.452 kg)    PHYSICAL EXAM BP 110/50 mmHg  Pulse 80  Ht 5\' 10"  (1.778 m)  Wt 145 lb (65.772 kg)  BMI 20.81 kg/m2  SpO2 99% General appearance: alert, cooperative, appears stated age, no distress and healthy-appearing. Well-nourished  and well-groomed Neck: no adenopathy, no carotid bruit and no JVD Lungs: clear to auscultation bilaterally, normal percussion bilaterally and non-labored Heart: regular rate and rhythm, S1, S2 normal, no murmur, click, rub or gallop; nondisplaced PMI. ICD in place on the left flank/rib cage. Mild puffy swelling just below the device but no erythema. No tenderness. Abdomen: soft, non-tender; bowel sounds normal; no masses, no organomegaly; no HJR Extremities: extremities normal, atraumatic, no cyanosis, or edema  Pulses: 2+ and symmetric;  Skin: mobility and turgor normal  Neurologic: Mental status: Alert, oriented, thought content appropriate Cranial nerves: normal (II-XII grossly  intact)    Adult ECG Report  Rate: 80 ;  Rhythm: normal sinus rhythm and Can rule out left atrial enlargement. Otherwise normal axis, intervals and durations.;   Narrative Interpretation: Stable, normal EKG   Other studies Reviewed: Additional studies/ records that were reviewed today include:  Recent Labs:   No recent labs   ASSESSMENT / PLAN: Problem List Items Addressed This Visit    VF (ventricular fibrillation) ([place])    Likely related to intense coronary spasm that resulted in inferior MI seen on cardiac MRI. Status post ICD placement. This was recently readjusted due to inappropriate shocks. She is on diltiazem.      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 It appeared to be more related to a subacute event. She had at least one preceding episode prior to her hospitalization. No heart failure symptoms. Has not had any further episodes.      Relevant Medications   isosorbide mononitrate (IMDUR) 30 MG 24 hr tablet   diltiazem (CARDIZEM CD) 120 MG 24 hr capsule   nitroGLYCERIN (NITROSTAT) 0.4 MG SL tablet   Other Relevant Orders   EKG 12-Lead   SICD- [place]   Relevant Orders   EKG 12-Lead   Insomnia (Chronic)    For now I think the best option would be for her to use melatonin at double dose to see if this works. If not I will give her prescription for Ambien 5 mg daily. Hopefully by letting her get more sleep, the panic attacks and a minimal improvement she will will be less likely to have any spasm.      Coronary artery spasm ([facility]) - Primary (Chronic)    On diltiazem and Imdur. Tolerating relatively well.  No further spasm episodes. We will refill when necessary nitroglycerin       Relevant Medications   isosorbide mononitrate (IMDUR) 30 MG 24 hr tablet   diltiazem (CARDIZEM CD) 120 MG 24 hr capsule   nitroGLYCERIN (NITROSTAT) 0.4 MG SL tablet   Other Relevant Orders   EKG 12-Lead      Current medicines are reviewed at length with the patient today. (+/- concerns) none The following changes have been made: none Studies Ordered:   Orders Placed This Encounter  Procedures  . EKG 12-Lead   Follow-up 1 year   Glenetta Hew, M.D., M.S. Interventional Cardiologist   Pager # [phone] Phone # [phone] [address] [place], [place] [address]

## 2015-06-13 NOTE — Patient Instructions (Signed)
No change with current medications  Your physician wants you to follow-up in 12 months with DR harding.  You will receive a reminder letter in the mail two months in advance. If you don't receive a letter, please call our office to schedule the follow-up appointment.  If you need a refill on your cardiac medications before your next appointment, please call your pharmacy.

## 2015-06-15 ENCOUNTER — Encounter: Payer: Self-pay | Admitting: Cardiology

## 2015-06-15 DIAGNOSIS — G4700 Insomnia, unspecified: Secondary | ICD-10-CM | POA: Insufficient documentation

## 2015-06-15 NOTE — Assessment & Plan Note (Signed)
She clearly had an MI on MRI. It appeared to be more related to a subacute event. She had at least one preceding episode prior to her hospitalization. No heart failure symptoms. Has not had any further episodes.

## 2015-06-15 NOTE — Assessment & Plan Note (Signed)
For now I think the best option would be for her to use melatonin at double dose to see if this works. If not I will give her prescription for Ambien 5 mg daily. Hopefully by letting her get more sleep, the panic attacks and a minimal improvement she will will be less likely to have any spasm.

## 2015-06-15 NOTE — Assessment & Plan Note (Signed)
Likely related to intense coronary spasm that resulted in inferior MI seen on cardiac MRI. Status post ICD placement. This was recently readjusted due to inappropriate shocks. She is on diltiazem.

## 2015-06-15 NOTE — Assessment & Plan Note (Signed)
On diltiazem and Imdur. Tolerating relatively well.  No further spasm episodes. We will refill when necessary nitroglycerin

## 2015-06-23 LAB — CUP PACEART REMOTE DEVICE CHECK
Battery Remaining Percentage: 87 %
Date Time Interrogation Session: 20170524023300
Implantable Lead Model: 3401
MDC IDC LEAD IMPLANT DT: 20160722
MDC IDC LEAD LOCATION: 753862
Pulse Gen Serial Number: 112653

## 2015-06-24 ENCOUNTER — Other Ambulatory Visit: Payer: Self-pay | Admitting: Cardiology

## 2015-06-24 NOTE — Telephone Encounter (Signed)
Rx request sent to pharmacy.  

## 2015-07-02 ENCOUNTER — Encounter: Payer: Self-pay | Admitting: Cardiology

## 2015-07-30 ENCOUNTER — Encounter: Payer: BC Managed Care – PPO | Admitting: Internal Medicine

## 2015-08-20 ENCOUNTER — Encounter: Payer: Self-pay | Admitting: Internal Medicine

## 2015-08-20 ENCOUNTER — Ambulatory Visit (INDEPENDENT_AMBULATORY_CARE_PROVIDER_SITE_OTHER): Payer: BC Managed Care – PPO | Admitting: Internal Medicine

## 2015-08-20 ENCOUNTER — Encounter (INDEPENDENT_AMBULATORY_CARE_PROVIDER_SITE_OTHER): Payer: Self-pay

## 2015-08-20 VITALS — BP 104/80 | HR 70 | Ht 70.0 in | Wt 144.6 lb

## 2015-08-20 DIAGNOSIS — I4901 Ventricular fibrillation: Secondary | ICD-10-CM

## 2015-08-20 DIAGNOSIS — I201 Angina pectoris with documented spasm: Secondary | ICD-10-CM | POA: Diagnosis not present

## 2015-08-20 DIAGNOSIS — Z95810 Presence of automatic (implantable) cardiac defibrillator: Secondary | ICD-10-CM | POA: Diagnosis not present

## 2015-08-20 DIAGNOSIS — R42 Dizziness and giddiness: Secondary | ICD-10-CM

## 2015-08-20 NOTE — Patient Instructions (Signed)
Medication Instructions: - Your physician recommends that you continue on your current medications as directed. Please refer to the Current Medication list given to you today.  Labwork: - none  Procedures/Testing: - none  Follow-Up: - Your physician wants you to follow-up in: 6 months with Dr. Klein. You will receive a reminder letter in the mail two months in advance. If you don't receive a letter, please call our office to schedule the follow-up appointment.  Any Additional Special Instructions Will Be Listed Below (If Applicable).     If you need a refill on your cardiac medications before your next appointment, please call your pharmacy.   

## 2015-08-20 NOTE — Progress Notes (Signed)
Patient Care Team: Louretta Shorten, MD as PCP - General (Obstetrics and Gynecology) Michael Boston, MD as Consulting Physician (General Surgery) Leonie Man, MD as Consulting Physician (Cardiology) Deboraha Sprang, MD as Consulting Physician (Cardiology)   HPI  Jessica Collier is a 55 y.o. female Seen in follow-up for cardiac arrest related to inferior wall spasm. MRI scanning had demonstrated scarring suggesting prior episodes of spasm as well. She underwent ICD implantation for secondary prevention--subcutaneous  She has had no intercurrent shocks   Had a great vacation or two  Otherwise she has been fine  Recent change in the formulation of her isosorbide was associated with increasing problems with orthostatic lightheadedness. Her volume status is somewhat deplete.  Past Medical History:  Diagnosis Date  . AICD (automatic cardioverter/defibrillator) present 08/04/2014   [facility] scientific device implanted subcutaneously because of ventricular fibrillation occurring during the setting of coronary spasm   . Cardiac arrest ([place]) 07/31/2014   Related to spasm induced inferior STEMI  . 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  . Inappropriate shocks from ICD (implantable cardioverter-defibrillator) 03/05/2015  . PONV (postoperative nausea and vomiting)   . ST elevation myocardial infarction (STEMI) of inferior wall, due to SPASM 07/31/2014   July/2016 thought due to coronary spasm-normal coronary arteries noted.  Cardiac MRI confirms area of infarction in the territory of the right coronary artery.     Past Surgical History:  Procedure Laterality Date  . CARDIAC CATHETERIZATION N/A 07/31/2014   Procedure: Left Heart Cath and Coronary Angiography;  Surgeon: Troy Sine, MD;  Location: [facility];  Service: Cardiovascular;  normal coronary arteries.  Normal EF  . Cardiac MRI  08/01/2014   EF 58%. Apical inferior and septal akinesis and mild hypokinesis of basal inferior and inferolateral walls; late gadolinium enhancement of mid inferior, apical inferior and septal walls involving true apex and RV apex consistent with transmural infarction in RCA/PDA territory with RV involvement. RVEF 41% = mildly decreased with dyskinesis of the apical portion.  Marland Kitchen CESAREAN SECTION     X 2  . CHOLECYSTECTOMY    . COLONOSCOPY    . ENDOMETRIAL ABLATION    . EP IMPLANTABLE DEVICE N/A 08/03/2014   Procedure: SubQ ICD Implant;  Surgeon: Deboraha Sprang, MD;  Location: [facility];  Service: Cardiovascular;  Laterality: N/A;  . INSERTION OF MESH N/A 12/26/2014   Procedure: INSERTION OF MESH;  Surgeon: Michael Boston, MD;  Location: [facility];  Service: General;  Laterality: N/A;  . TRANSTHORACIC ECHOCARDIOGRAM  08/01/2014    Normal wall motion. Normal diastolic function. Trivial MR  . VENTRAL HERNIA REPAIR N/A 12/26/2014   Procedure: LAPAROSCOPIC REPAIR OF PERIUMBILICAL INCISIONAL HERNIA;  Surgeon: Michael Boston, MD;  Location: [facility];  Service: General;  Laterality: N/A;    Current Outpatient Prescriptions  Medication Sig Dispense Refill  . diltiazem (CARDIZEM CD) 120 MG 24 hr capsule Take 1 capsule (120 mg total) by mouth daily. 30 capsule 11  . isosorbide mononitrate (IMDUR) 30 MG 24 hr tablet Take 1 tablet (30 mg total) by mouth daily. 30 tablet 11  . nitroGLYCERIN (NITROSTAT) 0.4 MG SL tablet Place 1 tablet (0.4 mg total) under the tongue every 5 (five) minutes as needed for chest pain. 25 tablet 6  . zolpidem (AMBIEN) 5 MG tablet Take 1 tablet (5 mg total) by  mouth at bedtime as needed for sleep. (Patient not taking: Reported on 08/20/2015) 30 tablet 2   No current facility-administered medications for this visit.     No Known Allergies    Review of Systems negative except from HPI and PMH  Physical Exam BP 104/80 (BP Location: Left Arm, Patient  Position: Sitting, Cuff Size: Normal)   Pulse 70   Ht 5\' 10"  (1.778 m)   Wt 144 lb 9.6 oz (65.6 kg)   BMI 20.75 kg/m  Well developed and well nourished in no acute distress HENT normal E scleral and icterus clear Neck Supple JVP flat; carotids brisk and full Clear to ausculation Device pocket well healed; without hematoma or erythema.  There is no tethering *Regular rate and rhythm, no murmurs gallops or rub Soft with active bowel sounds No clubbing cyanosis  Edema Alert and oriented, grossly normal motor and sensory function Skin Warm and Dry    Assessment and  Plan  Coronary spasm  Cardiac arrest associated with the above  Myocardial infarction associated with the above  Orthostatic lightheadedness  ICD-S  Inappropriate shock none of late  Continue current course

## 2015-11-19 ENCOUNTER — Ambulatory Visit (INDEPENDENT_AMBULATORY_CARE_PROVIDER_SITE_OTHER): Payer: BC Managed Care – PPO | Admitting: *Deleted

## 2015-11-19 ENCOUNTER — Telehealth: Payer: Self-pay | Admitting: Cardiology

## 2015-11-19 DIAGNOSIS — I4901 Ventricular fibrillation: Secondary | ICD-10-CM

## 2015-11-19 NOTE — Telephone Encounter (Signed)
LMOVM reminding pt to send remote transmission.   

## 2015-11-20 ENCOUNTER — Encounter: Payer: Self-pay | Admitting: Cardiology

## 2015-11-20 NOTE — Progress Notes (Signed)
Remote ICD transmission.   

## 2015-12-12 ENCOUNTER — Other Ambulatory Visit: Payer: Self-pay | Admitting: Cardiology

## 2015-12-13 NOTE — Telephone Encounter (Signed)
Pharmacy requesting a refill on ambien. Would you like to refill this medication? Please advise

## 2015-12-17 LAB — CUP PACEART REMOTE DEVICE CHECK
Battery Remaining Percentage: 81 %
MDC IDC LEAD IMPLANT DT: 20160722
MDC IDC LEAD LOCATION: 753862
MDC IDC LEAD MODEL: 3401
MDC IDC PG IMPLANT DT: 20160722
MDC IDC SESS DTM: 20171107221700
Pulse Gen Serial Number: 112653

## 2015-12-18 NOTE — Telephone Encounter (Signed)
Per Dr Ellyn Hack, okay to refill medication for patient. Phone -in refilled.

## 2015-12-27 ENCOUNTER — Telehealth: Payer: Self-pay | Admitting: Cardiology

## 2015-12-27 NOTE — Telephone Encounter (Signed)
FYI --See note

## 2015-12-27 NOTE — Telephone Encounter (Signed)
Spoke to the patient. She notes she's been having symptoms of chest discomfort, burning, x2 months off and on. She's tried several things to relieve her symptoms, notes it does not feel like her cardiac symptoms she had preceding stemi. She notes no SOB, no incidence of pain on exertion. States mainly she has tried prilosec and zantac, along w elevating her upper body w pillows when sleeping.  She states she can see GI but would prefer initial visit w [place] to rule out cardiac concerns. Advised I could schedule w APP at her convenience. 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 Pt aware of appt time and to call back sooner if new concerns/questions.

## 2015-12-27 NOTE — Telephone Encounter (Signed)
New message    Pt verbalized that she has been having heart burn for a month and that she wants to speak to the rn about possible of GERD

## 2015-12-30 NOTE — Telephone Encounter (Signed)
That sounds reasonable. I hopefully will be available to answer questions if they have them.  -- I will be in the Cath Lab, but I will not be on vacation, so wouldn't be able to answer questions.

## 2015-12-31 NOTE — Telephone Encounter (Signed)
Patient has OV 01/02/16 with Lurena Joiner, [place]

## 2016-01-02 ENCOUNTER — Encounter: Payer: Self-pay | Admitting: Cardiology

## 2016-01-02 ENCOUNTER — Telehealth: Payer: Self-pay | Admitting: *Deleted

## 2016-01-02 ENCOUNTER — Encounter (INDEPENDENT_AMBULATORY_CARE_PROVIDER_SITE_OTHER): Payer: Self-pay

## 2016-01-02 ENCOUNTER — Ambulatory Visit (INDEPENDENT_AMBULATORY_CARE_PROVIDER_SITE_OTHER): Payer: BC Managed Care – PPO | Admitting: Cardiology

## 2016-01-02 DIAGNOSIS — Z8674 Personal history of sudden cardiac arrest: Secondary | ICD-10-CM | POA: Insufficient documentation

## 2016-01-02 DIAGNOSIS — I2119 ST elevation (STEMI) myocardial infarction involving other coronary artery of inferior wall: Secondary | ICD-10-CM

## 2016-01-02 DIAGNOSIS — R079 Chest pain, unspecified: Secondary | ICD-10-CM

## 2016-01-02 DIAGNOSIS — T82198D Other mechanical complication of other cardiac electronic device, subsequent encounter: Secondary | ICD-10-CM

## 2016-01-02 DIAGNOSIS — Z95810 Presence of automatic (implantable) cardiac defibrillator: Secondary | ICD-10-CM

## 2016-01-02 DIAGNOSIS — R0789 Other chest pain: Secondary | ICD-10-CM

## 2016-01-02 NOTE — Assessment & Plan Note (Signed)
Pt complaining of vague chest pain, seems to be worse with position, unlike previous coronary spasm symptoms.

## 2016-01-02 NOTE — Assessment & Plan Note (Signed)
BS ICD implanted July 2016 secondary to VF occurring during the setting of coronary spasm

## 2016-01-02 NOTE — Assessment & Plan Note (Signed)
VF cardiac arrest, CPR and defibrillation 07/31/14 in setting of an acute inferior STEMI secondary to coronary spasm.

## 2016-01-02 NOTE — Progress Notes (Signed)
01/02/2016 Jessica Collier   07/03/1960  [id number]  Primary Physician Luz Lex, MD Primary Cardiologist: Der harding  HPI:  55 y.o. female who was admitted on the 07/31/14 with ventricular fibrillation in the setting of inferior STEMI. She had CPR and defibrillation. Cardiac catheterization revealed essentially normal coronary arteries with likely spasm. EF was normal. Cardiac MRI actually showed akinesis of the apical inferior and septal walls and dyskinesis of the apical portion of the RV free wall. There are findings consistent with transmural infarction of the RCA/PDA territory with RV involvement. As result of the concerning findings of infarction from coronary spasm, the decision was made to prophylactically place a subcutaneous ICD. Her ICD was reprogrammed in Feb 2017 secondary to inappropriate shocks. She is in the office today with complaints of chest pain. She describes a vague chest discomfort that seems to be positional. It's worse when she lays on Lt side. She has noted some Lt arm pain but her symptoms are not exertional. Her symptoms have been present for two months. She has taken Nexium without improvement.     Current Outpatient Prescriptions  Medication Sig Dispense Refill  . diltiazem (CARDIZEM CD) 120 MG 24 hr capsule Take 1 capsule (120 mg total) by mouth daily. 30 capsule 11  . isosorbide mononitrate (IMDUR) 30 MG 24 hr tablet Take 1 tablet (30 mg total) by mouth daily. 30 tablet 11  . nitroGLYCERIN (NITROSTAT) 0.4 MG SL tablet Place 1 tablet (0.4 mg total) under the tongue every 5 (five) minutes as needed for chest pain. 25 tablet 6  . zolpidem (AMBIEN) 5 MG tablet TAKE 1 TABLET BY MOUTH AT BEDTIME AS NEEDED FOR SLEEP 30 tablet 5   No current facility-administered medications for this visit.     No Known Allergies  Social History   Social History  . Marital status: Married    Spouse name: N/A  . Number of children: N/A  . Years of education: N/A    Occupational History  . Not on file.   Social History Main Topics  . Smoking status: Never Smoker  . Smokeless tobacco: Never Used  . Alcohol use No  . Drug use: No  . Sexual activity: Yes   Other Topics Concern  . Not on file   Social History Narrative  . No narrative on file     Review of Systems: General: negative for chills, fever, night sweats or weight changes.  Cardiovascular: negative for chest pain, dyspnea on exertion, edema, orthopnea, palpitations, paroxysmal nocturnal dyspnea or shortness of breath Dermatological: negative for rash Respiratory: negative for cough or wheezing Urologic: negative for hematuria Abdominal: negative for nausea, vomiting, diarrhea, bright red blood per rectum, melena, or hematemesis Neurologic: negative for visual changes, syncope, or dizziness All other systems reviewed and are otherwise negative except as noted above.    Blood pressure 110/70, pulse 71, height 5\' 10"  (1.778 m), weight 148 lb 9.6 oz (67.4 kg).  General appearance: alert, cooperative and no distress Neck: no carotid bruit and no JVD Lungs: clear to auscultation bilaterally Heart: regular rate and rhythm and no murmur or rub Abdomen: soft, non-tender; bowel sounds normal; no masses,  no organomegaly Extremities: extremities normal, atraumatic, no cyanosis or edema Pulses: 2+ and symmetric Skin: Skin color, texture, turgor normal. No rashes or lesions Neurologic: Grossly normal  EKG NSR, NSST changes  ASSESSMENT AND PLAN:   Chest pain of uncertain etiology Pt complaining of vague chest pain, seems to be worse with position,  unlike previous coronary spasm symptoms.  ST elevation myocardial infarction (STEMI) of inferior wall, due to SPASM July/2016 thought due to coronary spasm-normal coronary arteries noted.  Cardiac MRI confirms area of infarction in the territory of the right coronary artery.  History of cardiac arrest VF cardiac arrest, CPR and  defibrillation 07/31/14 in setting of an acute inferior STEMI secondary to coronary spasm.   SICD- [place] BS ICD implanted July 2016 secondary to VF occurring during the setting of coronary spasm  Inappropriate shocks from ICD (implantable cardioverter-defibrillator) ICD reprogrammed Feb 2017   PLAN  Discussed with Dr Ellyn Hack and Dr Meda Coffee. Will order a coronary CTA and Dr Meda Coffee will comment on the pericardium to r/o pericarditis as well as significant CAD.   Kerin Ransom PA-C 01/02/2016 8:18 AM

## 2016-01-02 NOTE — Assessment & Plan Note (Signed)
ICD reprogrammed Feb 2017

## 2016-01-02 NOTE — Assessment & Plan Note (Deleted)
July/2016 thought due to coronary spasm-normal coronary arteries noted.  Cardiac MRI confirms area of infarction in the territory of the right coronary artery.

## 2016-01-02 NOTE — Telephone Encounter (Signed)
Called patient-made aware of Dr. Ellyn Hack recommendations per Jana Half.  Coronary CT order placed and message sent to scheduling.  Pt aware and verbalized understanding.    Advised to call with further questions and/or concerns.

## 2016-01-02 NOTE — Assessment & Plan Note (Signed)
July/2016 thought due to coronary spasm-normal coronary arteries noted.  Cardiac MRI confirms area of infarction in the territory of the right coronary artery.

## 2016-01-02 NOTE — Patient Instructions (Signed)
Your physician recommends that you schedule a follow-up appointment in: to be determined when Jessica Ransom PA speaks to Dr. Ellyn Hack.

## 2016-01-03 ENCOUNTER — Encounter: Payer: Self-pay | Admitting: Cardiology

## 2016-01-03 ENCOUNTER — Telehealth: Payer: Self-pay | Admitting: *Deleted

## 2016-01-03 NOTE — Telephone Encounter (Signed)
Received call from CT scheduling-unable to get CT scheduled before 12/27 as recommended per PA.    Per OV note:  PLAN  Discussed with Dr Ellyn Hack and Dr Meda Coffee. Will order a coronary CTA and Dr Meda Coffee will comment on the pericardium to r/o pericarditis as well as significant CAD.   Kerin Ransom PA-C 01/02/2016 8:18 AM  Routed to MD Ellyn Hack and Kerin Ransom to make aware.

## 2016-01-03 NOTE — Telephone Encounter (Signed)
CT scheduled for 12/26 at 1:30pm

## 2016-01-07 ENCOUNTER — Ambulatory Visit (HOSPITAL_COMMUNITY)
Admission: RE | Admit: 2016-01-07 | Discharge: 2016-01-07 | Disposition: A | Discharge status: Home / Self Care | Payer: BC Managed Care – PPO | Source: Ambulatory Visit | Attending: Cardiology | Admitting: Cardiology

## 2016-01-07 DIAGNOSIS — Z95810 Presence of automatic (implantable) cardiac defibrillator: Secondary | ICD-10-CM | POA: Insufficient documentation

## 2016-01-07 DIAGNOSIS — R0789 Other chest pain: Secondary | ICD-10-CM | POA: Insufficient documentation

## 2016-01-07 DIAGNOSIS — R079 Chest pain, unspecified: Secondary | ICD-10-CM

## 2016-01-07 DIAGNOSIS — R918 Other nonspecific abnormal finding of lung field: Secondary | ICD-10-CM | POA: Diagnosis not present

## 2016-01-07 DIAGNOSIS — I219 Acute myocardial infarction, unspecified: Secondary | ICD-10-CM | POA: Diagnosis not present

## 2016-01-07 DIAGNOSIS — J8410 Pulmonary fibrosis, unspecified: Secondary | ICD-10-CM | POA: Insufficient documentation

## 2016-01-07 MED ORDER — IOPAMIDOL (ISOVUE-370) INJECTION 76%
INTRAVENOUS | Status: AC
  Administered 2016-01-07: 100 mL
  Filled 2016-01-07: qty 100

## 2016-01-07 MED ORDER — NITROGLYCERIN 0.4 MG SL SUBL
0.8000 mg | SUBLINGUAL_TABLET | Freq: Once | SUBLINGUAL | Status: DC
  Filled 2016-01-07: qty 25

## 2016-01-07 MED ORDER — NITROGLYCERIN 0.4 MG SL SUBL
SUBLINGUAL_TABLET | SUBLINGUAL | Status: DC
Start: 2016-01-07 — End: 2016-01-07
  Filled 2016-01-07: qty 1

## 2016-01-08 ENCOUNTER — Telehealth: Payer: Self-pay | Admitting: Cardiology

## 2016-01-08 NOTE — Telephone Encounter (Signed)
-----   Message from Erlene Quan, [place] sent at 01/07/2016  5:09 PM EST ----- Please le the patient know there was no obvious coronary blockage or fluid around her heart on CT.  LUKE KILROY PA-C 01/07/2016 5:10 PM

## 2016-01-08 NOTE — Telephone Encounter (Signed)
New Message    Patient calling to get results from CT scan. Please call.

## 2016-01-08 NOTE — Telephone Encounter (Signed)
Results communicated to patient, who verbalized understanding and thanks.

## 2016-01-08 NOTE — Telephone Encounter (Signed)
Returned call to patient-made aware results need to be reviewed by Dr. Ellyn Hack or Kerin Ransom PA.  Once reviewed we will call with results.    Pt verbalized understanding.

## 2016-02-27 ENCOUNTER — Ambulatory Visit (INDEPENDENT_AMBULATORY_CARE_PROVIDER_SITE_OTHER): Payer: BC Managed Care – PPO | Admitting: Internal Medicine

## 2016-02-27 ENCOUNTER — Encounter: Payer: Self-pay | Admitting: Internal Medicine

## 2016-02-27 VITALS — BP 122/80 | HR 83 | Ht 70.0 in | Wt 148.4 lb

## 2016-02-27 DIAGNOSIS — I4901 Ventricular fibrillation: Secondary | ICD-10-CM

## 2016-02-27 DIAGNOSIS — Z95810 Presence of automatic (implantable) cardiac defibrillator: Secondary | ICD-10-CM | POA: Diagnosis not present

## 2016-02-27 DIAGNOSIS — I201 Angina pectoris with documented spasm: Secondary | ICD-10-CM

## 2016-02-27 NOTE — Patient Instructions (Addendum)
Medication Instructions:  Your physician recommends that you continue on your current medications as directed. Please refer to the Current Medication list given to you today.   Labwork: None Ordered   Testing/Procedures: None Ordered   Follow-Up: Your physician wants you to follow-up in: 1 year with Dr. Caryl Comes.  You will receive a reminder letter in the mail two months in advance. If you don't receive a letter, please call our office to schedule the follow-up appointment.  Remote monitoring is used to monitor your ICD from home. This monitoring reduces the number of office visits required to check your device to one time per year. It allows [place] to keep an eye on the functioning of your device to ensure it is working properly. You are scheduled for a device check from home on 05/28/16. You may send your transmission at any time that day. If you have a wireless device, the transmission will be sent automatically. After your physician reviews your transmission, you will receive a postcard with your next transmission date.    Any Other Special Instructions Will Be Listed Below (If Applicable).     If you need a refill on your cardiac medications before your next appointment, please call your pharmacy.

## 2016-03-04 NOTE — Progress Notes (Signed)
Patient Care Team: Louretta Shorten, MD as PCP - General (Obstetrics and Gynecology) Michael Boston, MD as Consulting Physician (General Surgery) Leonie Man, MD as Consulting Physician (Cardiology) Deboraha Sprang, MD as Consulting Physician (Cardiology)   HPI  Jessica Collier is a 56 y.o. female Seen in follow-up for cardiac arrest related to inferior wall spasm. MRI scanning had demonstrated scarring suggesting prior episodes of spasm as well. She underwent ICD implantation for secondary prevention--subcutaneous  She has had no intercurrent shocks    Otherwise she has been fine; less lightheadedness   No chest pain   Past Medical History:  Diagnosis Date  . AICD (automatic cardioverter/defibrillator) present 08/04/2014   [facility] scientific device implanted subcutaneously because of ventricular fibrillation occurring during the setting of coronary spasm   . Cardiac arrest ([place]) 07/31/2014   Related to spasm induced inferior STEMI  . 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  . Inappropriate shocks from ICD (implantable cardioverter-defibrillator) 03/05/2015  . PONV (postoperative nausea and vomiting)   . ST elevation myocardial infarction (STEMI) of inferior wall, due to SPASM 07/31/2014   July/2016 thought due to coronary spasm-normal coronary arteries noted.  Cardiac MRI confirms area of infarction in the territory of the right coronary artery.     Past Surgical History:  Procedure Laterality Date  . CARDIAC CATHETERIZATION N/A 07/31/2014   Procedure: Left Heart Cath and Coronary Angiography;  Surgeon: Troy Sine, MD;  Location: [facility];  Service: Cardiovascular;  normal coronary arteries. Normal EF  . Cardiac MRI  08/01/2014   EF 58%. Apical inferior and septal akinesis and mild hypokinesis of basal inferior and inferolateral walls; late  gadolinium enhancement of mid inferior, apical inferior and septal walls involving true apex and RV apex consistent with transmural infarction in RCA/PDA territory with RV involvement. RVEF 41% = mildly decreased with dyskinesis of the apical portion.  Marland Kitchen CESAREAN SECTION     X 2  . CHOLECYSTECTOMY    . COLONOSCOPY    . ENDOMETRIAL ABLATION    . EP IMPLANTABLE DEVICE N/A 08/03/2014   Procedure: SubQ ICD Implant;  Surgeon: Deboraha Sprang, MD;  Location: [facility];  Service: Cardiovascular;  Laterality: N/A;  . INSERTION OF MESH N/A 12/26/2014   Procedure: INSERTION OF MESH;  Surgeon: Michael Boston, MD;  Location: [facility];  Service: General;  Laterality: N/A;  . TRANSTHORACIC ECHOCARDIOGRAM  08/01/2014    Normal wall motion. Normal diastolic function. Trivial MR  . VENTRAL HERNIA REPAIR N/A 12/26/2014   Procedure: LAPAROSCOPIC REPAIR OF PERIUMBILICAL INCISIONAL HERNIA;  Surgeon: Michael Boston, MD;  Location: [facility];  Service: General;  Laterality: N/A;    Current Outpatient Prescriptions  Medication Sig Dispense Refill  . diltiazem (CARDIZEM CD) 120 MG 24 hr capsule Take 1 capsule (120 mg total) by mouth daily. 30 capsule 11  . isosorbide mononitrate (IMDUR) 30 MG 24 hr tablet Take 1 tablet (30 mg total) by mouth daily. 30 tablet 11  . nitroGLYCERIN (NITROSTAT) 0.4 MG SL tablet Place 1 tablet (0.4 mg total) under the tongue every 5 (five) minutes as needed for chest pain. 25 tablet 6  . zolpidem (AMBIEN) 5 MG tablet TAKE 1 TABLET BY MOUTH AT BEDTIME AS NEEDED FOR SLEEP 30 tablet 5   No current facility-administered medications for this visit.     No  Known Allergies    Review of Systems negative except from HPI and PMH  Physical Exam BP 122/80   Pulse 83   Ht 5\' 10"  (1.778 m)   Wt 148 lb 6.4 oz (67.3 kg)   SpO2 98%   BMI 21.29 kg/m  Well developed and well nourished in no acute distress HENT normal E scleral and icterus clear Neck Supple JVP flat; carotids brisk and  full Clear to ausculation Device pocket well healed; without hematoma or erythema.  There is no tethering *Regular rate and rhythm, no murmurs gallops or rub Soft with active bowel sounds No clubbing cyanosis  Edema Alert and oriented, grossly normal motor and sensory function Skin Warm and Dry   ECG NSR    Assessment and  Plan  Coronary spasm  Cardiac arrest associated with the above  Myocardial infarction associated with the above  Orthostatic lightheadedness  ICD-S  doing well   LH better  no chest pain; tolerating meds

## 2016-03-27 LAB — CUP PACEART INCLINIC DEVICE CHECK
Date Time Interrogation Session: 20180316172144
Implantable Lead Implant Date: 20160722
Implantable Lead Model: 3401
MDC IDC LEAD LOCATION: 753862
MDC IDC PG IMPLANT DT: 20160722
Pulse Gen Serial Number: 112653

## 2016-04-10 ENCOUNTER — Telehealth: Payer: Self-pay | Admitting: Cardiology

## 2016-04-10 NOTE — Telephone Encounter (Signed)
Pt called and wanted to know if it would be ok to ride the amusement park rides at [employer] in [facility] w/ her SICD? After consulting w/ Device Tech RN I informed pt that she has had her device for almost 2 years she is fine to ride the rides. Pt verbalized understanding.

## 2016-04-21 ENCOUNTER — Other Ambulatory Visit: Payer: Self-pay | Admitting: Cardiology

## 2016-04-21 NOTE — Telephone Encounter (Signed)
REFILL 

## 2016-05-22 ENCOUNTER — Other Ambulatory Visit: Payer: Self-pay | Admitting: Cardiology

## 2016-05-28 ENCOUNTER — Telehealth: Payer: Self-pay | Admitting: Cardiology

## 2016-05-28 ENCOUNTER — Ambulatory Visit (INDEPENDENT_AMBULATORY_CARE_PROVIDER_SITE_OTHER): Payer: BC Managed Care – PPO | Admitting: *Deleted

## 2016-05-28 DIAGNOSIS — I4901 Ventricular fibrillation: Secondary | ICD-10-CM

## 2016-05-28 DIAGNOSIS — Z95810 Presence of automatic (implantable) cardiac defibrillator: Secondary | ICD-10-CM

## 2016-05-28 NOTE — Telephone Encounter (Signed)
Spoke with pt and reminded pt of remote transmission that is due today. Pt verbalized understanding.   

## 2016-05-29 ENCOUNTER — Encounter: Payer: Self-pay | Admitting: Cardiology

## 2016-05-29 ENCOUNTER — Other Ambulatory Visit: Payer: Self-pay

## 2016-05-29 MED ORDER — DILTIAZEM HCL ER COATED BEADS 120 MG PO CP24: 120.0000 mg | ORAL_CAPSULE | Freq: Every day | ORAL | 2 refills | Status: DC

## 2016-05-29 NOTE — Progress Notes (Signed)
Remote ICD transmission.   

## 2016-05-29 NOTE — Telephone Encounter (Signed)
Called patient  to advise needs to schedule appointment with Dr. Ellyn Hack in July for additional refills. No answer, left message on voice mail as stated above.

## 2016-06-04 LAB — CUP PACEART REMOTE DEVICE CHECK
Battery Remaining Percentage: 75 %
Implantable Lead Location: 753862
Implantable Lead Model: 3401
MDC IDC LEAD IMPLANT DT: 20160722
MDC IDC PG IMPLANT DT: 20160722
MDC IDC PG SERIAL: 112653
MDC IDC SESS DTM: 20180518021500

## 2016-06-12 ENCOUNTER — Other Ambulatory Visit: Payer: Self-pay | Admitting: Cardiology

## 2016-06-12 NOTE — Telephone Encounter (Signed)
REFILL 

## 2016-06-29 ENCOUNTER — Other Ambulatory Visit: Payer: Self-pay | Admitting: Cardiology

## 2016-07-07 ENCOUNTER — Other Ambulatory Visit: Payer: Self-pay | Admitting: Cardiology

## 2016-07-07 NOTE — Telephone Encounter (Signed)
REFILL 

## 2016-07-11 ENCOUNTER — Other Ambulatory Visit: Payer: Self-pay | Admitting: Cardiology

## 2016-08-27 ENCOUNTER — Ambulatory Visit (INDEPENDENT_AMBULATORY_CARE_PROVIDER_SITE_OTHER): Payer: BC Managed Care – PPO | Admitting: *Deleted

## 2016-08-27 DIAGNOSIS — I4901 Ventricular fibrillation: Secondary | ICD-10-CM

## 2016-08-27 DIAGNOSIS — Z95810 Presence of automatic (implantable) cardiac defibrillator: Secondary | ICD-10-CM

## 2016-08-28 NOTE — Progress Notes (Signed)
Remote defibrillator check.  

## 2016-09-03 LAB — CUP PACEART REMOTE DEVICE CHECK
Battery Remaining Percentage: 72 %
Implantable Lead Implant Date: 20160722
Implantable Lead Location: 753862
Implantable Lead Model: 3401
Implantable Pulse Generator Implant Date: 20160722
MDC IDC PG SERIAL: 112653
MDC IDC SESS DTM: 20180817022900

## 2016-09-11 ENCOUNTER — Encounter: Payer: Self-pay | Admitting: Cardiology

## 2016-10-09 ENCOUNTER — Telehealth: Payer: Self-pay | Admitting: Internal Medicine

## 2016-10-09 NOTE — Telephone Encounter (Signed)
Pt called and has questions about taking allergy medication please give her a call back.

## 2016-10-09 NOTE — Telephone Encounter (Signed)
Per Dr. Caryl Comes- ok for anything without a "D". I have spoken with the patient- she is having some dizziness which she thinks is vertigo and is seeing an ENT next week. She would like to try an allergy med to see if this will help. I have advised her that plain claritin/ allegra would be ok to try, but just to avoid the "D" portion. She voices understanding.

## 2016-11-26 ENCOUNTER — Ambulatory Visit (INDEPENDENT_AMBULATORY_CARE_PROVIDER_SITE_OTHER): Payer: BC Managed Care – PPO | Admitting: *Deleted

## 2016-11-26 ENCOUNTER — Telehealth: Payer: Self-pay | Admitting: Cardiology

## 2016-11-26 DIAGNOSIS — I4901 Ventricular fibrillation: Secondary | ICD-10-CM | POA: Diagnosis not present

## 2016-11-26 NOTE — Telephone Encounter (Signed)
LMOVM reminding pt to send remote transmission.   

## 2016-11-27 NOTE — Progress Notes (Signed)
Remote ICD transmission.   

## 2016-12-02 ENCOUNTER — Encounter: Payer: Self-pay | Admitting: Cardiology

## 2016-12-11 LAB — CUP PACEART REMOTE DEVICE CHECK
Battery Remaining Percentage: 69 %
Implantable Lead Location: 753862
Implantable Lead Model: 3401
Implantable Pulse Generator Implant Date: 20160722
MDC IDC LEAD IMPLANT DT: 20160722
MDC IDC PG SERIAL: 112653
MDC IDC SESS DTM: 20181116042300

## 2017-02-25 ENCOUNTER — Telehealth: Payer: Self-pay | Admitting: Cardiology

## 2017-02-25 ENCOUNTER — Ambulatory Visit (INDEPENDENT_AMBULATORY_CARE_PROVIDER_SITE_OTHER): Payer: BC Managed Care – PPO | Admitting: *Deleted

## 2017-02-25 DIAGNOSIS — I4901 Ventricular fibrillation: Secondary | ICD-10-CM

## 2017-02-25 NOTE — Telephone Encounter (Signed)
Spoke with pt and reminded pt of remote transmission that is due today. Pt verbalized understanding.   

## 2017-03-01 NOTE — Progress Notes (Signed)
Remote ICD transmission.   

## 2017-03-03 ENCOUNTER — Encounter: Payer: Self-pay | Admitting: Cardiology

## 2017-03-04 ENCOUNTER — Other Ambulatory Visit: Payer: Self-pay | Admitting: Obstetrics and Gynecology

## 2017-03-04 DIAGNOSIS — Z1231 Encounter for screening mammogram for malignant neoplasm of breast: Secondary | ICD-10-CM

## 2017-03-14 LAB — CUP PACEART REMOTE DEVICE CHECK
Date Time Interrogation Session: 20190214235000
Implantable Lead Implant Date: 20160722
Implantable Lead Model: 3401
MDC IDC LEAD LOCATION: 753862
MDC IDC MSMT BATTERY REMAINING PERCENTAGE: 66 %
MDC IDC PG IMPLANT DT: 20160722
Pulse Gen Serial Number: 112653

## 2017-03-15 ENCOUNTER — Ambulatory Visit: Payer: BC Managed Care – PPO | Admitting: Internal Medicine

## 2017-03-15 ENCOUNTER — Encounter: Payer: Self-pay | Admitting: Internal Medicine

## 2017-03-15 VITALS — BP 120/74 | HR 79 | Resp 13 | Ht 70.0 in | Wt 153.8 lb

## 2017-03-15 DIAGNOSIS — I4901 Ventricular fibrillation: Secondary | ICD-10-CM

## 2017-03-15 DIAGNOSIS — Z95810 Presence of automatic (implantable) cardiac defibrillator: Secondary | ICD-10-CM

## 2017-03-15 DIAGNOSIS — Z8674 Personal history of sudden cardiac arrest: Secondary | ICD-10-CM

## 2017-03-15 NOTE — Patient Instructions (Addendum)
Medication Instructions:  Your physician recommends that you continue on your current medications as directed. Please refer to the Current Medication list given to you today.  Labwork: Troponin on Tuesday  Testing/Procedures: None ordered.  Follow-Up: Your physician recommends that you schedule a follow-up appointment in: One Year with Dr Caryl Comes    Any Other Special Instructions Will Be Listed Below (If Applicable).     If you need a refill on your cardiac medications before your next appointment, please call your pharmacy.

## 2017-03-16 ENCOUNTER — Other Ambulatory Visit: Payer: Self-pay | Admitting: *Deleted

## 2017-03-16 ENCOUNTER — Other Ambulatory Visit: Payer: BC Managed Care – PPO

## 2017-03-16 DIAGNOSIS — R079 Chest pain, unspecified: Secondary | ICD-10-CM

## 2017-03-16 DIAGNOSIS — I4901 Ventricular fibrillation: Secondary | ICD-10-CM

## 2017-03-16 DIAGNOSIS — Z95810 Presence of automatic (implantable) cardiac defibrillator: Secondary | ICD-10-CM

## 2017-03-16 DIAGNOSIS — Z8674 Personal history of sudden cardiac arrest: Secondary | ICD-10-CM

## 2017-03-16 LAB — TROPONIN T: Troponin T TROPT: <0.011 ng/mL (ref <0.011)

## 2017-03-17 NOTE — Progress Notes (Signed)
Patient Care Team: Louretta Shorten, MD as PCP - General (Obstetrics and Gynecology) Michael Boston, MD as Consulting Physician (General Surgery) Leonie Man, MD as Consulting Physician (Cardiology) Deboraha Sprang, MD as Consulting Physician (Cardiology)   HPI  Jessica Collier is a 57 y.o. female Seen in follow-up for cardiac arrest related to inferior wall spasm. MRI scanning had demonstrated scarring suggesting prior episodes of spasm as well. She underwent ICD implantation for secondary prevention--subcutaneous  No intercurrent shocks  The patient denies chest pain, shortness of breath, nocturnal dyspnea, orthopnea or peripheral edema.  There have been no palpitations or syncope.  Min orthostatic lightheadedness      Past Medical History:  Diagnosis Date  . AICD (automatic cardioverter/defibrillator) present 08/04/2014   [facility] scientific device implanted subcutaneously because of ventricular fibrillation occurring during the setting of coronary spasm   . Cardiac arrest ([place]) 07/31/2014   Related to spasm induced inferior STEMI  . 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  . Inappropriate shocks from ICD (implantable cardioverter-defibrillator) 03/05/2015  . PONV (postoperative nausea and vomiting)   . ST elevation myocardial infarction (STEMI) of inferior wall, due to SPASM 07/31/2014   July/2016 thought due to coronary spasm-normal coronary arteries noted.  Cardiac MRI confirms area of infarction in the territory of the right coronary artery.     Past Surgical History:  Procedure Laterality Date  . CARDIAC CATHETERIZATION N/A 07/31/2014   Procedure: Left Heart Cath and Coronary Angiography;  Surgeon: Troy Sine, MD;  Location: [place] [facility];  Service: Cardiovascular;  normal coronary arteries. Normal EF  . Cardiac MRI  08/01/2014   EF  58%. Apical inferior and septal akinesis and mild hypokinesis of basal inferior and inferolateral walls; late gadolinium enhancement of mid inferior, apical inferior and septal walls involving true apex and RV apex consistent with transmural infarction in RCA/PDA territory with RV involvement. RVEF 41% = mildly decreased with dyskinesis of the apical portion.  Marland Kitchen CESAREAN SECTION     X 2  . CHOLECYSTECTOMY    . COLONOSCOPY    . ENDOMETRIAL ABLATION    . EP IMPLANTABLE DEVICE N/A 08/03/2014   Procedure: SubQ ICD Implant;  Surgeon: Deboraha Sprang, MD;  Location: [place] [facility];  Service: Cardiovascular;  Laterality: N/A;  . INSERTION OF MESH N/A 12/26/2014   Procedure: INSERTION OF MESH;  Surgeon: Michael Boston, MD;  Location: [place];  Service: General;  Laterality: N/A;  . TRANSTHORACIC ECHOCARDIOGRAM  08/01/2014    Normal wall motion. Normal diastolic function. Trivial MR  . VENTRAL HERNIA REPAIR N/A 12/26/2014   Procedure: LAPAROSCOPIC REPAIR OF PERIUMBILICAL INCISIONAL HERNIA;  Surgeon: Michael Boston, MD;  Location: [place];  Service: General;  Laterality: N/A;    Current Outpatient Medications  Medication Sig Dispense Refill  . diltiazem (CARDIZEM CD) 120 MG 24 hr capsule TAKE 1 CAPSULE (120 MG TOTAL) BY MOUTH DAILY. 30 capsule 9  . isosorbide mononitrate (IMDUR) 30 MG 24 hr tablet TAKE 1 TABLET (30 MG TOTAL) BY MOUTH DAILY. 30 tablet 10  . nitroGLYCERIN (NITROSTAT) 0.4 MG SL tablet PLACE 1 TABLET (0.4 MG TOTAL) UNDER THE TONGUE EVERY 5 (FIVE) MINUTES AS NEEDED FOR CHEST PAIN. 25 tablet 3  . zolpidem (AMBIEN) 5 MG tablet TAKE 1 TABLET BY MOUTH AT BEDTIME AS NEEDED FOR SLEEP 30 tablet 5  No current facility-administered medications for this visit.     No Known Allergies    Review of Systems negative except from HPI and PMH  Physical Exam BP 120/74 (BP Location: Right Arm)   Pulse 79   Resp 13   Ht 5\' 10"  (1.778 m)   Wt 153 lb 12.8 oz (69.8 kg)   BMI 22.07 kg/m  Well developed  and nourished in no acute distress HENT normal Neck supple with JVP-flat Clear Regular rate and rhythm, no murmurs or gallops Abd-soft with active BS No Clubbing cyanosis edema Skin-warm and dry A & Oriented  Grossly normal sensory and motor function    ECG  NSR with narrow qQRS    Assessment and  Plan  Coronary spasm  Cardiac arrest associated with the above  Myocardial infarction associated with the above  Orthostatic lightheadedness  ICD-S    LH less of an issue  Discussion re NSAIDs and impact on prostaglandins and concerns in spasm, suggested acetaminophen instead  Otherwise well   Thinking of the next phase of work  We spent more than 50% of our >25 min visit in face to face counseling regarding the above

## 2017-03-18 LAB — CUP PACEART INCLINIC DEVICE CHECK
Implantable Lead Implant Date: 20160722
Implantable Lead Location: 753862
Implantable Pulse Generator Implant Date: 20160722
MDC IDC PG SERIAL: 112653
MDC IDC SESS DTM: 20190307165609

## 2017-03-24 ENCOUNTER — Ambulatory Visit
Admission: RE | Admit: 2017-03-24 | Discharge: 2017-03-24 | Disposition: A | Discharge status: Home / Self Care | Payer: BC Managed Care – PPO | Source: Ambulatory Visit | Attending: Obstetrics and Gynecology | Admitting: Obstetrics and Gynecology

## 2017-03-24 DIAGNOSIS — Z1231 Encounter for screening mammogram for malignant neoplasm of breast: Secondary | ICD-10-CM

## 2017-05-27 ENCOUNTER — Telehealth: Payer: Self-pay | Admitting: Cardiology

## 2017-05-27 ENCOUNTER — Ambulatory Visit: Payer: BC Managed Care – PPO | Admitting: *Deleted

## 2017-05-27 NOTE — Telephone Encounter (Signed)
Attempted to confirm remote transmission with pt. No answer and was unable to leave a message.   

## 2017-05-28 ENCOUNTER — Encounter: Payer: Self-pay | Admitting: Cardiology

## 2017-05-28 NOTE — Progress Notes (Signed)
Not received  

## 2017-05-31 ENCOUNTER — Ambulatory Visit (INDEPENDENT_AMBULATORY_CARE_PROVIDER_SITE_OTHER): Payer: BC Managed Care – PPO | Admitting: *Deleted

## 2017-05-31 DIAGNOSIS — I4901 Ventricular fibrillation: Secondary | ICD-10-CM

## 2017-05-31 NOTE — Progress Notes (Signed)
Remote ICD transmission.   

## 2017-06-02 LAB — CUP PACEART REMOTE DEVICE CHECK
Battery Remaining Percentage: 64 %
Date Time Interrogation Session: 20190519002700
MDC IDC LEAD IMPLANT DT: 20160722
MDC IDC LEAD LOCATION: 753862
MDC IDC PG IMPLANT DT: 20160722
Pulse Gen Serial Number: 112653

## 2017-06-15 ENCOUNTER — Other Ambulatory Visit: Payer: Self-pay | Admitting: Cardiology

## 2017-06-15 NOTE — Telephone Encounter (Signed)
REFILL 

## 2017-07-11 ENCOUNTER — Other Ambulatory Visit: Payer: Self-pay | Admitting: Cardiology

## 2017-08-11 ENCOUNTER — Telehealth: Payer: Self-pay | Admitting: Internal Medicine

## 2017-08-11 NOTE — Telephone Encounter (Signed)
Pt calling   Pt want to know what allergy medication can she take. Please call pt.

## 2017-08-11 NOTE — Telephone Encounter (Signed)
Pt calls today asking what she can take for seasonal allergies. She states she has been having some dizziness and her pharmacist suggested she take something for her allergies. I reviewed her medications with her and advised her to stay away from any nasal decongestants including anything with pseudoephedrine in it. I advised her to speak with her PCP regarding her dizziness to be sure it is not associated with anything more than seasonal allergies. Pt agreed to plan and had no additional questions.

## 2017-08-30 ENCOUNTER — Ambulatory Visit (INDEPENDENT_AMBULATORY_CARE_PROVIDER_SITE_OTHER): Payer: BC Managed Care – PPO | Admitting: *Deleted

## 2017-08-30 DIAGNOSIS — I4901 Ventricular fibrillation: Secondary | ICD-10-CM

## 2017-08-31 ENCOUNTER — Telehealth: Payer: Self-pay | Admitting: Cardiology

## 2017-08-31 NOTE — Telephone Encounter (Signed)
LMOVM reminding pt to send remote transmission.   

## 2017-09-01 NOTE — Progress Notes (Signed)
Remote ICD transmission.   

## 2017-10-04 LAB — CUP PACEART REMOTE DEVICE CHECK
Date Time Interrogation Session: 20190923143054
Implantable Lead Model: 3401
MDC IDC LEAD IMPLANT DT: 20160722
MDC IDC LEAD LOCATION: 753862
MDC IDC PG IMPLANT DT: 20160722
Pulse Gen Serial Number: 112653

## 2017-10-10 ENCOUNTER — Other Ambulatory Visit: Payer: Self-pay | Admitting: Cardiology

## 2017-10-22 ENCOUNTER — Other Ambulatory Visit: Payer: Self-pay | Admitting: Cardiology

## 2017-11-29 ENCOUNTER — Ambulatory Visit (INDEPENDENT_AMBULATORY_CARE_PROVIDER_SITE_OTHER): Payer: BC Managed Care – PPO

## 2017-11-29 ENCOUNTER — Telehealth: Payer: Self-pay

## 2017-11-29 DIAGNOSIS — I4901 Ventricular fibrillation: Secondary | ICD-10-CM | POA: Diagnosis not present

## 2017-11-29 NOTE — Telephone Encounter (Signed)
LMOVM reminding pt to send remote transmission.   

## 2017-12-01 NOTE — Progress Notes (Signed)
Remote ICD transmission.   

## 2017-12-02 ENCOUNTER — Encounter: Payer: Self-pay | Admitting: Cardiology

## 2018-01-25 LAB — CUP PACEART REMOTE DEVICE CHECK
Date Time Interrogation Session: 20200114200551
Implantable Lead Implant Date: 20160722
Implantable Lead Model: 3401
Implantable Pulse Generator Implant Date: 20160722
MDC IDC LEAD LOCATION: 753862
MDC IDC PG SERIAL: 112653

## 2018-02-28 ENCOUNTER — Ambulatory Visit (INDEPENDENT_AMBULATORY_CARE_PROVIDER_SITE_OTHER): Payer: BC Managed Care – PPO

## 2018-02-28 DIAGNOSIS — I469 Cardiac arrest, cause unspecified: Secondary | ICD-10-CM

## 2018-03-01 ENCOUNTER — Telehealth: Payer: Self-pay

## 2018-03-01 NOTE — Telephone Encounter (Signed)
Left message for patient to remind of missed remote transmission.  

## 2018-03-03 LAB — CUP PACEART REMOTE DEVICE CHECK
Battery Remaining Percentage: 54 %
Date Time Interrogation Session: 20200219030700
Implantable Lead Location: 753862
Implantable Pulse Generator Implant Date: 20160722
MDC IDC LEAD IMPLANT DT: 20160722
MDC IDC PG SERIAL: 112653

## 2018-03-09 NOTE — Progress Notes (Signed)
Remote ICD transmission.   

## 2018-03-12 ENCOUNTER — Other Ambulatory Visit: Payer: Self-pay | Admitting: Cardiology

## 2018-03-14 NOTE — Telephone Encounter (Signed)
Rx(s) sent to pharmacy electronically.  

## 2018-03-15 ENCOUNTER — Ambulatory Visit: Payer: BC Managed Care – PPO | Admitting: Internal Medicine

## 2018-03-15 ENCOUNTER — Encounter: Payer: Self-pay | Admitting: Internal Medicine

## 2018-03-15 VITALS — BP 110/74 | HR 75 | Ht 70.0 in | Wt 148.4 lb

## 2018-03-15 DIAGNOSIS — Z8674 Personal history of sudden cardiac arrest: Secondary | ICD-10-CM | POA: Diagnosis not present

## 2018-03-15 DIAGNOSIS — I201 Angina pectoris with documented spasm: Secondary | ICD-10-CM | POA: Diagnosis not present

## 2018-03-15 DIAGNOSIS — R1013 Epigastric pain: Secondary | ICD-10-CM | POA: Diagnosis not present

## 2018-03-15 DIAGNOSIS — R0600 Dyspnea, unspecified: Secondary | ICD-10-CM

## 2018-03-15 DIAGNOSIS — Z95810 Presence of automatic (implantable) cardiac defibrillator: Secondary | ICD-10-CM

## 2018-03-15 NOTE — Patient Instructions (Signed)
Medication Instructions:  Your physician recommends that you continue on your current medications as directed. Please refer to the Current Medication list given to you today.   Labwork:  None ordered today  Procedures/Testing:  Your physician has requested that you have an echocardiogram. Echocardiography is a painless test that uses sound waves to create images of your heart. It provides your doctor with information about the size and shape of your heart and how well your heart's chambers and valves are working. This procedure takes approximately one hour. There are no restrictions for this procedure.    Follow-Up:  Your physician recommends that you schedule a follow-up appointment in: 1 year with Dr. Caryl Comes  Any Additional Special Instructions Will Be Listed Below (If Applicable).     If you need a refill on your cardiac medications before your next appointment, please call your pharmacy.

## 2018-03-15 NOTE — Progress Notes (Signed)
Patient Care Team: Louretta Shorten, MD as PCP - General (Obstetrics and Gynecology) Michael Boston, MD as Consulting Physician (General Surgery) Leonie Man, MD as Consulting Physician (Cardiology) Deboraha Sprang, MD as Consulting Physician (Cardiology)   HPI  Jessica Collier is a 58 y.o. female Seen in follow-up for cardiac arrest related to inferior wall spasm. MRI scanning had demonstrated scarring suggesting prior episodes of spasm as well. She underwent ICD implantation for secondary prevention--subcutaneous No intercurrent shocks The patient denies chest pain, nocturnal dyspnea, orthopnea or peripheral edema.  There have been no palpitations, lightheadedness or syncope  DATE TEST EF   7/16 Echo   55 %   7/16 cMRI   55 % Full thickness Gad enhancement and RV apex          Some dyspnea with exertion but not w rest .      Past Medical History:  Diagnosis Date  . AICD (automatic cardioverter/defibrillator) present 08/04/2014   [facility] scientific device implanted subcutaneously because of ventricular fibrillation occurring during the setting of coronary spasm   . Cardiac arrest ([place]) 07/31/2014   Related to spasm induced inferior STEMI  . 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  . Inappropriate shocks from ICD (implantable cardioverter-defibrillator) 03/05/2015  . PONV (postoperative nausea and vomiting)   . ST elevation myocardial infarction (STEMI) of inferior wall, due to SPASM 07/31/2014   July/2016 thought due to coronary spasm-normal coronary arteries noted.  Cardiac MRI confirms area of infarction in the territory of the right coronary artery.     Past Surgical History:  Procedure Laterality Date  . CARDIAC CATHETERIZATION N/A 07/31/2014   Procedure: Left Heart Cath and Coronary Angiography;  Surgeon: Troy Sine, MD;  Location: [place] [facility];  Service: Cardiovascular;  normal coronary arteries. Normal EF  . Cardiac MRI  08/01/2014   EF 58%. Apical inferior and septal akinesis and mild hypokinesis of basal inferior and inferolateral walls; late gadolinium enhancement of mid inferior, apical inferior and septal walls involving true apex and RV apex consistent with transmural infarction in RCA/PDA territory with RV involvement. RVEF 41% = mildly decreased with dyskinesis of the apical portion.  Marland Kitchen CESAREAN SECTION     X 2  . CHOLECYSTECTOMY    . COLONOSCOPY    . ENDOMETRIAL ABLATION    . EP IMPLANTABLE DEVICE N/A 08/03/2014   Procedure: SubQ ICD Implant;  Surgeon: Deboraha Sprang, MD;  Location: [place] [facility];  Service: Cardiovascular;  Laterality: N/A;  . INSERTION OF MESH N/A 12/26/2014   Procedure: INSERTION OF MESH;  Surgeon: Michael Boston, MD;  Location: [place];  Service: General;  Laterality: N/A;  . TRANSTHORACIC ECHOCARDIOGRAM  08/01/2014    Normal wall motion. Normal diastolic function. Trivial MR  . VENTRAL HERNIA REPAIR N/A 12/26/2014   Procedure: LAPAROSCOPIC REPAIR OF PERIUMBILICAL INCISIONAL HERNIA;  Surgeon: Michael Boston, MD;  Location: [place];  Service: General;  Laterality: N/A;    Current Outpatient Medications  Medication Sig Dispense Refill  . diltiazem (CARDIZEM CD) 120 MG 24 hr capsule Take 1 capsule (120 mg total) by mouth daily. 30 capsule 11  . isosorbide mononitrate (IMDUR) 30 MG 24 hr tablet Take 1 tablet (30 mg total) by mouth daily. NEEDS APPOINTMENT WITH DR HARDING FOR FUTURE REFILLS 30 tablet 0  . nitroGLYCERIN (NITROSTAT) 0.4 MG SL  tablet PLACE 1 TABLET (0.4 MG TOTAL) UNDER THE TONGUE EVERY 5 (FIVE) MINUTES AS NEEDED FOR CHEST PAIN. 25 tablet 3  . zolpidem (AMBIEN) 5 MG tablet TAKE 1 TABLET BY MOUTH AT BEDTIME AS NEEDED FOR SLEEP 30 tablet 5   No current facility-administered medications for this visit.     No Known Allergies    Review of Systems negative except from HPI and  PMH  Physical Exam BP 110/74   Pulse 75   Ht 5\' 10"  (1.778 m)   Wt 148 lb 6.4 oz (67.3 kg)   SpO2 98%   BMI 21.29 kg/m  Well developed and well nourished in no acute distress HENT normal Neck supple with JVP-flat Clear Device pocket well healed; without hematoma or erythema.  There is no tethering  Regular rate and rhythm, no  murmur Abd-soft with active BS No Clubbing cyanosis  edema Skin-warm and dry A & Oriented  Grossly normal sensory and motor function Grossly   ECG  Sinus 75 17/08/39 LAD -34 Low volts    Assessment and  Plan  Coronary spasm  Cardiac arrest associated with the above  DOE  Myocardial infarction associated with the above  ICD-S The patient's device was interrogated.  The information was reviewed. No changes were made in the programming.      No interval chest pain  No intercurrent Ventricular tachycardia  Will get an echo for dyspnea

## 2018-03-16 LAB — CUP PACEART INCLINIC DEVICE CHECK
Implantable Lead Implant Date: 20160722
Implantable Lead Model: 3401
Implantable Pulse Generator Implant Date: 20160722
MDC IDC LEAD LOCATION: 753862
MDC IDC SESS DTM: 20200304165254
Pulse Gen Serial Number: 112653

## 2018-03-29 ENCOUNTER — Telehealth: Payer: Self-pay | Admitting: Cardiology

## 2018-03-29 NOTE — Telephone Encounter (Signed)
Called patient today to reschedule 2D echo due to coronavirus pandemic.  After discussion with Dr. Caryl Comes he feels patient is fine to wait on echo.  Patient is not having any problems currently.  I encouraged her to call the office if problems arise in the mean time. Please reschedule patient's echo for 6 to 8 weeks out.

## 2018-03-30 ENCOUNTER — Other Ambulatory Visit (HOSPITAL_COMMUNITY): Payer: BC Managed Care – PPO

## 2018-03-30 NOTE — Telephone Encounter (Signed)
Also called  Oh well

## 2018-04-01 ENCOUNTER — Other Ambulatory Visit (HOSPITAL_COMMUNITY): Payer: BC Managed Care – PPO

## 2018-04-08 ENCOUNTER — Other Ambulatory Visit: Payer: Self-pay | Admitting: Cardiology

## 2018-04-28 ENCOUNTER — Telehealth: Payer: Self-pay | Admitting: Internal Medicine

## 2018-04-28 NOTE — Telephone Encounter (Signed)
Patient seen by Ricky Ala in early March 2020 He recomm echo for eval of dyspnea  I have reviewed chart with visit   No edema  No PND Previous echo a few years ago normal  With concerns for corona virus, would recomm rescheduling echo when infectoin status improves, restrictions eased   REcomm:  Mid June or later

## 2018-05-01 ENCOUNTER — Other Ambulatory Visit: Payer: Self-pay | Admitting: Cardiology

## 2018-05-02 ENCOUNTER — Other Ambulatory Visit (HOSPITAL_COMMUNITY): Payer: BC Managed Care – PPO

## 2018-05-02 NOTE — Telephone Encounter (Signed)
Imdur 30 mg refilled.

## 2018-05-03 ENCOUNTER — Telehealth: Payer: Self-pay | Admitting: *Deleted

## 2018-05-03 NOTE — Telephone Encounter (Signed)
Patient called back stating Dr. Ellyn Hack told her she did not need to see him and Dr. Caryl Comes as long as she saw Dr. Caryl Comes on a yearly basis.  Should her medications be transferred to Dr. Olin Pia name?

## 2018-05-03 NOTE — Telephone Encounter (Signed)
Yes. Thank you for calling her.

## 2018-05-03 NOTE — Telephone Encounter (Signed)
Left message for patient to call and schedule virtual/telephone visit with Dr. Ellyn Hack for medication management--last seen 2017

## 2018-05-03 NOTE — Telephone Encounter (Signed)
New Message   Patient returning your call states she see's Dr. Caryl Comes and Dr. Ellyn Hack said she doesn't need to see him anymore.  Please call patient back.

## 2018-05-30 ENCOUNTER — Encounter: Payer: BC Managed Care – PPO | Admitting: *Deleted

## 2018-05-30 ENCOUNTER — Other Ambulatory Visit: Payer: Self-pay

## 2018-05-31 ENCOUNTER — Telehealth: Payer: Self-pay

## 2018-05-31 NOTE — Telephone Encounter (Signed)
Left message for patient to remind of missed remote transmission.  

## 2018-06-14 ENCOUNTER — Other Ambulatory Visit: Payer: Self-pay

## 2018-06-14 ENCOUNTER — Emergency Department (HOSPITAL_COMMUNITY): Payer: BC Managed Care – PPO

## 2018-06-14 ENCOUNTER — Encounter (HOSPITAL_COMMUNITY): Payer: Self-pay | Admitting: Emergency Medicine

## 2018-06-14 ENCOUNTER — Emergency Department (HOSPITAL_COMMUNITY)
Admission: EM | Admit: 2018-06-14 | Discharge: 2018-06-14 | Disposition: A | Discharge status: Home / Self Care | Payer: BC Managed Care – PPO | Attending: Emergency Medicine | Admitting: Emergency Medicine

## 2018-06-14 ENCOUNTER — Telehealth: Payer: Self-pay | Admitting: Internal Medicine

## 2018-06-14 DIAGNOSIS — R200 Anesthesia of skin: Secondary | ICD-10-CM | POA: Diagnosis not present

## 2018-06-14 DIAGNOSIS — Z79899 Other long term (current) drug therapy: Secondary | ICD-10-CM | POA: Diagnosis not present

## 2018-06-14 DIAGNOSIS — R0789 Other chest pain: Secondary | ICD-10-CM | POA: Insufficient documentation

## 2018-06-14 DIAGNOSIS — I252 Old myocardial infarction: Secondary | ICD-10-CM | POA: Diagnosis not present

## 2018-06-14 LAB — I-STAT TROPONIN, ED: Troponin i, poc: 0 ng/mL (ref 0.00–0.08)

## 2018-06-14 LAB — CBC WITH DIFFERENTIAL/PLATELET
Abs Immature Granulocytes: 0.01 10*3/uL (ref 0.00–0.07)
Basophils Absolute: 0 10*3/uL (ref 0.0–0.1)
Basophils Relative: 0 %
Eosinophils Absolute: 0.1 10*3/uL (ref 0.0–0.5)
Eosinophils Relative: 2 %
HCT: 40.5 % (ref 36.0–46.0)
Hemoglobin: 13.6 g/dL (ref 12.0–15.0)
Immature Granulocytes: 0 %
Lymphocytes Relative: 20 %
Lymphs Abs: 0.9 10*3/uL (ref 0.7–4.0)
MCH: 28.5 pg (ref 26.0–34.0)
MCHC: 33.6 g/dL (ref 30.0–36.0)
MCV: 84.9 fL (ref 80.0–100.0)
Monocytes Absolute: 0.3 10*3/uL (ref 0.1–1.0)
Monocytes Relative: 8 %
Neutro Abs: 2.9 10*3/uL (ref 1.7–7.7)
Neutrophils Relative %: 70 %
Platelets: 196 10*3/uL (ref 150–400)
RBC: 4.77 MIL/uL (ref 3.87–5.11)
RDW: 12.2 % (ref 11.5–15.5)
WBC: 4.2 10*3/uL (ref 4.0–10.5)
nRBC: 0 % (ref 0.0–0.2)

## 2018-06-14 LAB — BASIC METABOLIC PANEL
Anion gap: 11 (ref 5–15)
BUN: 10 mg/dL (ref 6–20)
CO2: 26 mmol/L (ref 22–32)
Calcium: 9.5 mg/dL (ref 8.9–10.3)
Chloride: 105 mmol/L (ref 98–111)
Creatinine, Ser: 0.73 mg/dL (ref 0.44–1.00)
GFR calc Af Amer: >60 mL/min (ref >60)
GFR calc non Af Amer: >60 mL/min (ref >60)
Glucose, Bld: 98 mg/dL (ref 70–99)
Potassium: 3.8 mmol/L (ref 3.5–5.1)
Sodium: 142 mmol/L (ref 135–145)

## 2018-06-14 LAB — TROPONIN I: Troponin I: <0.03 ng/mL (ref <0.03)

## 2018-06-14 MED ORDER — NITROGLYCERIN 0.4 MG SL SUBL
0.4000 mg | SUBLINGUAL_TABLET | SUBLINGUAL | Status: DC | PRN
  Administered 2018-06-14: 0.4 mg via SUBLINGUAL
  Filled 2018-06-14: qty 1

## 2018-06-14 MED ORDER — ONDANSETRON HCL 4 MG/2ML IJ SOLN
4.0000 mg | Freq: Once | INTRAMUSCULAR | Status: AC
  Administered 2018-06-14: 4 mg via INTRAVENOUS
  Filled 2018-06-14: qty 2

## 2018-06-14 MED ORDER — ONDANSETRON HCL 4 MG/2ML IJ SOLN
4.0000 mg | Freq: Once | INTRAMUSCULAR | Status: AC
  Administered 2018-06-14: 4 mg via INTRAVENOUS

## 2018-06-14 MED ORDER — ONDANSETRON HCL 4 MG/2ML IJ SOLN
INTRAMUSCULAR | Status: AC
  Filled 2018-06-14: qty 2

## 2018-06-14 MED ORDER — MORPHINE SULFATE (PF) 4 MG/ML IV SOLN
4.0000 mg | Freq: Once | INTRAVENOUS | Status: AC
  Administered 2018-06-14: 4 mg via INTRAVENOUS
  Filled 2018-06-14: qty 1

## 2018-06-14 MED ORDER — METHOCARBAMOL 500 MG PO TABS: 500.0000 mg | ORAL_TABLET | Freq: Two times a day (BID) | ORAL | 0 refills | Status: DC

## 2018-06-14 MED ORDER — IOHEXOL 350 MG/ML SOLN
100.0000 mL | Freq: Once | INTRAVENOUS | Status: AC | PRN
  Administered 2018-06-14: 100 mL via INTRAVENOUS

## 2018-06-14 NOTE — ED Notes (Signed)
Pt ambulated to the restroom with steady gait. 

## 2018-06-14 NOTE — Telephone Encounter (Signed)
New Message:     Patient calling concering her having some chest pain. Patient also her left arm is hurting. Patient would like to see the doctor today.

## 2018-06-14 NOTE — ED Notes (Signed)
Husband-Ron- [phone], Information given to him.

## 2018-06-14 NOTE — ED Notes (Signed)
Patient transported to CT 

## 2018-06-14 NOTE — ED Triage Notes (Signed)
Pt arrives from home. Pt states last night she was having some chest pain and took nitro x2 and got some relief from that. Upon waking this morning with chest pain that radiates into her back and left arm numbness.

## 2018-06-14 NOTE — Discharge Instructions (Signed)
Your work-up today was reassuring.  Your EKG and lab work were within normal limits.  Additionally, as we discussed, your CT scan was reassuring.  Take Robaxin as prescribed. This medication will make you drowsy so do not drive or drink alcohol when taking it.  Follow-up with Dr. Caryl Comes.  Call his office arrange for appointment.  Follow-up with your primary care doctor.  Return emergency department for any worsening chest pain, difficulty breathing, numbness/weakness of your arms or legs.

## 2018-06-14 NOTE — ED Notes (Signed)
Patient transported to X-ray 

## 2018-06-14 NOTE — ED Notes (Signed)
Nurse Navigator communication: The patient has a cell phone at bedside and is able to give updates to husband who is currently outside the ED.    Nurse Secretary Leafy Ro, to call [employer] for interrogation of the patients defibrillator.

## 2018-06-14 NOTE — ED Provider Notes (Signed)
Pt's care turned over to me.  Pt pending pace maker evaluation.  Per report pt has 51 percent battery. Electrodes ok  Pt discharged to follow up with cardiology   Fransico Meadow, PA-C 06/14/18 [id number]    Pattricia Boss, MD 06/14/18 [id number]

## 2018-06-14 NOTE — ED Notes (Addendum)
This RN spoke with representative of [employer] about interrogating pt subcutaneous AICD. Rep states he will come down to the ED soon to do the interrogation.

## 2018-06-14 NOTE — ED Provider Notes (Signed)
[facility] EMERGENCY DEPARTMENT Provider Note   CSN: [id number] Arrival date & time: 06/14/18  7829    History   Chief Complaint Chief Complaint  Patient presents with  . Chest Pain    HPI Jessica Collier is a 58 y.o. 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  Patient states she started having some discomfort in the left side of her chest that radiated to the left arm and back.  She also reports some numbness/tingling sensation noted to the left upper extremity.  Patient states that it was not worse with exertion.  She had no associated shortness of breath, diaphoresis, nausea.  She states that pain was intermittent but that there was no particular action that would induce the pain and nothing that would alleviate the pain.  She states that eventually it resolved on its own.  She states that this morning, she woke up at 4 AM and pain returned.  She states she took nitro x2 which helped the pain and she was able to go back to sleep.  She reports waking up this morning several hours later and states that pain had returned.  She feels like now it is worse than it was.  She describes it as a 9/10 and states that it feels like a "intense type pain."  She states this does not feel like her previous episode of STEMI.  Patient states she has not noticed a rash on the area.  She denies any recent sicknesses.  Patient denies any fevers, cough, congestion, difficulty breathing, abdominal pain, numbness/weakness of her lower extremities, blurry vision.     The history is provided by the patient.    Past Medical History:  Diagnosis Date  . AICD (automatic cardioverter/defibrillator) present 08/04/2014   [facility] scientific device implanted subcutaneously because of ventricular fibrillation occurring during the setting of coronary spasm   . Cardiac arrest ([place])  07/31/2014   Related to spasm induced inferior STEMI  . 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  . Inappropriate shocks from ICD (implantable cardioverter-defibrillator) 03/05/2015  . PONV (postoperative nausea and vomiting)   . ST elevation myocardial infarction (STEMI) of inferior wall, due to SPASM 07/31/2014   July/2016 thought due to coronary spasm-normal coronary arteries noted.  Cardiac MRI confirms area of infarction in the territory of the right coronary artery.     Patient Active Problem List   Diagnosis Date Noted  . Chest pain of uncertain etiology 56/21/3086  . History of cardiac arrest 01/02/2016  . Insomnia 06/15/2015  . Inappropriate shocks from ICD (implantable cardioverter-defibrillator) 03/05/2015  . Incisional hernia, incarcerated 11/06/2014  . SICD- [place] 08/04/2014  . At risk for sudden cardiac death 23-Aug-2014  . 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 ST elevation myocardial infarction (STEMI) of inferior wall, due to SPASM 07/31/2014  . VF (ventricular fibrillation) ([place]) 07/31/2014    Past Surgical History:  Procedure Laterality Date  . CARDIAC CATHETERIZATION N/A 07/31/2014   Procedure: Left Heart Cath and Coronary Angiography;  Surgeon: Troy Sine, MD;  Location: [facility];  Service: Cardiovascular;  normal coronary arteries. Normal EF  . Cardiac MRI  08/01/2014   EF 58%. Apical inferior and septal akinesis and mild hypokinesis of basal inferior and inferolateral walls; late  gadolinium enhancement of mid inferior, apical inferior and septal walls involving true apex and RV apex consistent with transmural infarction in RCA/PDA territory with RV involvement. RVEF 41% = mildly decreased with dyskinesis of the apical portion.  Marland Kitchen CESAREAN SECTION     X 2  . CHOLECYSTECTOMY    . COLONOSCOPY    . ENDOMETRIAL ABLATION    . EP  IMPLANTABLE DEVICE N/A 08/03/2014   Procedure: SubQ ICD Implant;  Surgeon: Deboraha Sprang, MD;  Location: [facility];  Service: Cardiovascular;  Laterality: N/A;  . INSERTION OF MESH N/A 12/26/2014   Procedure: INSERTION OF MESH;  Surgeon: Michael [place], MD;  Location: [facility];  Service: General;  Laterality: N/A;  . TRANSTHORACIC ECHOCARDIOGRAM  08/01/2014    Normal wall motion. Normal diastolic function. Trivial MR  . VENTRAL HERNIA REPAIR N/A 12/26/2014   Procedure: LAPAROSCOPIC REPAIR OF PERIUMBILICAL INCISIONAL HERNIA;  Surgeon: Michael [place], MD;  Location: [facility];  Service: General;  Laterality: N/A;     OB History   No obstetric history on file.      Home Medications    Prior to Admission medications   Medication Sig Start Date End Date Taking? Authorizing Provider  diltiazem (CARDIZEM CD) 120 MG 24 hr capsule Take 1 capsule (120 mg total) by mouth daily. 06/15/17  Yes Leonie Man, MD  isosorbide mononitrate (IMDUR) 30 MG 24 hr tablet TAKE 1 TABLET (30 MG TOTAL) BY MOUTH DAILY. NEEDS APPOINTMENT WITH DR Ellyn Hack FOR FUTURE REFILLS Patient taking differently: Take 30 mg by mouth daily.  05/02/18  Yes Leonie Man, MD  nitroGLYCERIN (NITROSTAT) 0.4 MG SL tablet PLACE 1 TABLET (0.4 MG TOTAL) UNDER THE TONGUE EVERY 5 (FIVE) MINUTES AS NEEDED FOR CHEST PAIN. 10/22/17  Yes Leonie Man, MD  zolpidem (AMBIEN) 5 MG tablet TAKE 1 TABLET BY MOUTH AT BEDTIME AS NEEDED FOR SLEEP Patient taking differently: Take 2.5 mg by mouth at bedtime.  12/18/15  Yes Leonie Man, MD  methocarbamol (ROBAXIN) 500 MG tablet Take 1 tablet (500 mg total) by mouth 2 (two) times daily. 06/14/18   Volanda Napoleon, PA-C    Family History Family History  Problem Relation Age of Onset  . Hypertension Father     Social History Social History   Tobacco Use  . Smoking status: Never Smoker  . Smokeless tobacco: Never Used  Substance Use Topics  . Alcohol use: No  . Drug use: No      Allergies   Patient has no known allergies.   Review of Systems Review of Systems  Constitutional: Negative for fever.  Eyes: Negative for visual disturbance.  Respiratory: Negative for cough and shortness of breath.   Cardiovascular: Positive for chest pain.  Gastrointestinal: Negative for abdominal pain, nausea and vomiting.  Genitourinary: Negative for dysuria and hematuria.  Neurological: Positive for numbness. Negative for headaches.  All other systems reviewed and are negative.    Physical Exam Updated Vital Signs BP 115/65 (BP Location: Right Arm)   Pulse (!) 55   Temp 98.4 F (36.9 C) (Oral)   Resp 15   Ht 5\' 10"  (1.778 m)   Wt 65.8 kg   SpO2 99%   BMI 20.81 kg/m   Physical Exam Vitals signs and nursing note reviewed.  Constitutional:      Appearance: Normal appearance. She is well-developed.  HENT:     Head: Normocephalic and atraumatic.  Eyes:     General: Lids are normal.  Conjunctiva/sclera: Conjunctivae normal.     Pupils: Pupils are equal, round, and reactive to light.     Comments: PERRL. EOMs intact.   Neck:     Musculoskeletal: Full passive range of motion without pain.  Cardiovascular:     Rate and Rhythm: Normal rate and regular rhythm.     Pulses:          Radial pulses are 2+ on the right side and 1+ on the left side.       Dorsalis pedis pulses are 2+ on the right side and 2+ on the left side.     Heart sounds: Normal heart sounds. No murmur. No friction rub. No gallop.   Pulmonary:     Effort: Pulmonary effort is normal.     Breath sounds: Normal breath sounds.     Comments: Lungs clear to auscultation bilaterally.  Symmetric chest rise.  No wheezing, rales, rhonchi. Chest:     Comments: Tenderness palpation in the left anterior chest.  No deformity or crepitus noted. Abdominal:     Palpations: Abdomen is soft. Abdomen is not rigid.     Tenderness: There is no abdominal tenderness. There is no guarding.  Musculoskeletal: Normal  range of motion.  Skin:    General: Skin is warm and dry.     Capillary Refill: Capillary refill takes less than 2 seconds.     Comments: Good distal cap refill. BLE and BUE are not dusky in appearance or cool to touch.  Neurological:     Mental Status: She is alert and oriented to person, place, and time.     Comments: Cranial nerves III-XII intact Follows commands, Moves all extremities  5/5 strength to BUE and BLE  Sensation intact throughout all major nerve distributions Normal coordination No slurred speech. No facial droop.   Psychiatric:        Speech: Speech normal.      ED Treatments / Results  Labs (all labs ordered are listed, but only abnormal results are displayed) Labs Reviewed  BASIC METABOLIC PANEL  CBC WITH DIFFERENTIAL/PLATELET  TROPONIN I  I-STAT TROPONIN, ED    EKG EKG Interpretation  Date/Time:  Tuesday June 14 2018 09:08:03 EDT Ventricular Rate:  81 PR Interval:    QRS Duration: 97 QT Interval:  389 QTC Calculation: 452 R Axis:   -36 Text Interpretation:  Sinus rhythm Probable left atrial enlargement Left axis deviation Confirmed by Fredia Sorrow [phone]) on 06/14/2018 10:12:17 AM   Radiology Dg Chest 2 View  Result Date: 06/14/2018 CLINICAL DATA:  Chest pain. EXAM: CHEST - 2 VIEW COMPARISON:  Chest x-ray 08/04/2014. FINDINGS: Mediastinum hilar structures normal. Cardiac defibrillator in stable position. Heart size normal. Lungs are clear of acute infiltrates. No pleural effusion or pneumothorax. Surgical clips right upper quadrant. No acute bony abnormality. IMPRESSION: No acute cardiopulmonary disease.  Chest is stable from prior exam. Electronically Signed   By: Marcello Moores  Register   On: 06/14/2018 10:34   Ct Angio Chest/abd/pel For Dissection W And/or Wo Contrast  Result Date: 06/14/2018 CLINICAL DATA:  Chest and abdominal pain with radiation toward back EXAM: CT ANGIOGRAPHY CHEST, ABDOMEN AND PELVIS TECHNIQUE: Initially, axial CT images were  obtained through the chest without intravenous contrast material administration. Multidetector CT imaging through the chest, abdomen and pelvis was performed using the standard protocol during bolus administration of intravenous contrast. Multiplanar reconstructed images and MIPs were obtained and reviewed to evaluate the vascular anatomy. CONTRAST:  142mL OMNIPAQUE IOHEXOL 350 MG/ML SOLN COMPARISON:  Chest radiograph June 14, 2018. Chest CT for coronary artery scoring January 07, 2016 FINDINGS: CTA CHEST FINDINGS Cardiovascular: There is no intramural hematoma in the thoracic aorta on the noncontrast enhanced study. There is no thoracic aortic aneurysm or dissection. The visualized great vessels appear unremarkable. There is no appreciable thoracic aortic atherosclerosis or calcification. There is no demonstrable pulmonary embolus. There is no pericardial effusion or pericardial thickening. Pacer device is noted on the left laterally with lead anterior to the sternum. Mediastinum/Nodes: Visualized thyroid appears unremarkable. There is no demonstrable thoracic adenopathy. No esophageal lesions are appreciable. Lungs/Pleura: There is no evident edema or consolidation. No evident pleural effusion or pleural thickening. On axial slice 86 series 9, there is a 3 mm nodular opacity in the posterior segment of the right upper lobe. No new pulmonary nodular lesions evident. Musculoskeletal: No evident blastic or lytic bone lesions. No fracture or dislocation. No chest wall lesions. Note pacemaker device laterally on the left along the inferior hemithorax. Review of the MIP images confirms the above findings. CTA ABDOMEN AND PELVIS FINDINGS VASCULAR Aorta: There is no abdominal aortic aneurysm or dissection. No appreciable atherosclerosis noted. Celiac: The celiac artery and its major branches appear widely patent. No aneurysm or dissection. No appreciable atherosclerosis. SMA: Superior mesenteric artery and its branches  appear widely patent. No aneurysm or dissection. No appreciable atherosclerosis. Renals: There is a single renal artery on each side. Renal arteries and their branches appear widely patent without appreciable atherosclerosis. No aneurysm or dissection. No evident fibromuscular dysplasia. IMA: inferior mesenteric artery is quite diminutive. Inferior mesenteric artery and its branches appear patent without atherosclerotic plaque evident. No aneurysm or dissection involving the inferior mesenteric artery and its branches. Inflow: The major pelvic arterial vessels appear widely patent without appreciable atherosclerosis. No aneurysm or dissection involving these vessels. The proximal superficial femoral and profunda femoral arteries appear patent without atherosclerosis. No aneurysm or dissection in these vessels. Veins: No obvious venous abnormality within the limitations of this arterial phase study. Review of the MIP images confirms the above findings. NON-VASCULAR Hepatobiliary: Subcentimeter cystic areas are noted in the liver. No other liver lesions are evident on this arterial phase examination. Gallbladder is absent. There is no appreciable biliary duct dilatation. Pancreas: There is no apparent pancreatic mass or inflammatory focus. Spleen: No splenic lesions are evident. Adrenals/Urinary Tract: Adrenals bilaterally appear unremarkable. Kidneys bilaterally show no evident mass or hydronephrosis on either side. There is no evident renal or ureteral calculus on either side. Urinary bladder is midline with wall thickness within normal limits. Stomach/Bowel: There is no appreciable bowel wall or mesenteric thickening. There is no evident bowel obstruction. No free air or portal venous air. There is moderate stool throughout the colon. Lymphatic: There is no evident adenopathy in the abdomen or pelvis. Reproductive: Uterus is anteverted.  No evident pelvic mass. Other: Appendix appears unremarkable. There is no  evident abscess or ascites in the abdomen or pelvis. Musculoskeletal: No blastic or lytic bone lesions. No abdominal wall or intramuscular lesion evident. Review of the MIP images confirms the above findings. IMPRESSION: CT angiogram chest: 1. No thoracic aortic aneurysm or dissection. No appreciable thoracic aortic atherosclerosis. 2.  No demonstrable pulmonary embolus. 3. Stable 3 mm nodular opacity in the posterior segment right upper lobe since 2017. Stability since 2017 is indicative of benign etiology. No lung edema or consolidation. 4. Pacemaker device on the left laterally with lead anterior to the mid sternum. 5.  No demonstrable thoracic adenopathy. CT  angiogram abdomen; CT angiogram pelvis: 1. No demonstrable aneurysm or dissection involving the aorta, major mesenteric, and major pelvic arterial vessels. No appreciable atherosclerosis noted. No fibromuscular dysplasia. 2. No bowel obstruction. No abscess in the abdomen or pelvis. Appendix appears normal. 3. No evident renal or ureteral calculus. No hydronephrosis. Urinary bladder wall thickness unremarkable. 4. Gallbladder absent. Electronically Signed   By: Lowella Grip III M.D.   On: 06/14/2018 12:33    Procedures Procedures (including critical care time)  Medications Ordered in ED Medications  ondansetron (ZOFRAN) injection 4 mg (4 mg Intravenous Given 06/14/18 1003)  morphine 4 MG/ML injection 4 mg (4 mg Intravenous Given 06/14/18 1131)  iohexol (OMNIPAQUE) 350 MG/ML injection 100 mL (100 mLs Intravenous Contrast Given 06/14/18 1214)  ondansetron (ZOFRAN) injection 4 mg (4 mg Intravenous Given 06/14/18 1307)  morphine 4 MG/ML injection 4 mg (4 mg Intravenous Given 06/14/18 1327)     Initial Impression / Assessment and Plan / ED Course  I have reviewed the triage vital signs and the nursing notes.  Pertinent labs & imaging results that were available during my care of the patient were reviewed by me and considered in my medical decision  making (see chart for details).  Clinical Course as of Jun 14 1304  Tue Jun 14, 2018  1301 Neutrophils: 91 [LL]    Clinical Course User Index [LL] Volanda Napoleon, PA-C       58 year old female past medical history of inferior STEMI secondary to coronary artery spasm, presents for evaluation of chest pain that began last night.  Reports pain radiates to her back and arm.  Also reports some numbness/tingling sensation of the left upper extremity.  No associated nausea, diaphoresis, shortness of breath.  Called her cardiology (Dr. Caryl Comes) who advised her to come to the emergency department for further evaluation.  On initial ED arrival, she is afebrile.  She is slightly hypertensive but vitals otherwise stable.  Normal neuro exam.  Consider ACS etiology versus musculoskeletal injury.  Also concern for dissection given patient's pain description.  She has equal pulses in all 4 extremities but with pain and tingling sensation in left upper extremity, will plan for CTA. No rash that would be concerning for shingles.   Troponin negative.  BMP is unremarkable.  CBC without any significant leukocytosis or anemia.  Chest x-ray unremarkable.  CTA of chest shows no evidence of dissection noted.  No evidence of PE noted.  There is a stable 3 mm nodular opacity noted in the right upper lobe that is been there since 2017.  Patient with HEART score of 4.   Discussed patient with Dr. Cathie Olden (Cardio) who reviewed patient's EKG. Patient's presentation is atypical for ACS etiology. Recommends obtaining a delta troponin and if negative, can reasonably be discharged home with outpatient follow-up. No indication for admission. Discussed patient with Dr. Rogene Houston who is agreeable to plan.   Delta trop negative. Re-evaluation. Patient reports improvement in pain. Patient is pending interogator for her AICD device.   Patient signed out to Alyse Low, PA-C with AICD interrogation pending. Anticipate discharge home.    Portions of this note were generated with [employer]. Dictation errors may occur despite best attempts at proofreading.  Final Clinical Impressions(s) / ED Diagnoses   Final diagnoses:  Atypical chest pain    ED Discharge Orders         Ordered    methocarbamol (ROBAXIN) 500 MG tablet  2 times daily     06/14/18  [address]           Desma Mcgregor 06/15/18 1306    Fredia Sorrow, MD 06/15/18 1931

## 2018-06-14 NOTE — Telephone Encounter (Signed)
Noted  

## 2018-06-14 NOTE — Telephone Encounter (Signed)
Pt calling today with c/o chest pain and left arm pain. She states last night it felt severe, she took nitro tabs. After 2 tabs the pain was relieved and she was able to sleep. This morning she woke up with left arm pain, numbness and tingling. She wants to know if she can be seen today.  I advised her, given her past medical history, I would seek care at an ED asap. She states last time Dr Caryl Comes drew blood work, however given her pain is acute and still lingering from overnight, I believe it is best she seek care at the ED for a cardiac work up.   She stated "ok." I was not clear if she would head to the ED.

## 2018-06-16 ENCOUNTER — Telehealth: Payer: Self-pay | Admitting: Internal Medicine

## 2018-06-16 NOTE — Telephone Encounter (Signed)
Spoke with patient who called to ask if she can take medication for shingles with her cardiac medications. States when she went to the ED earlier this week, they told her that she could have shingles and that if so, eruptions would begin in the next few days. Patient states she has eruptions and pain on various parts of her body. I advised her to seek help from PCP as soon as possible for proper diagnosis and prescription. I advised her to make certain the provider is aware of all medications she is taking so that they can prescribe appropriately for treatment of pain and shingles. I answered questions to her satisfaction and she thanked me for the call.

## 2018-06-16 NOTE — Telephone Encounter (Signed)
New Message:    Pt says she has Shingles. She wants to ask Dr Caryl Comes if she should see the doctor for this. She is also concerned about taking medicine for the Shingles with her condition and her other other medicine.

## 2018-06-17 NOTE — Telephone Encounter (Signed)
Noted  

## 2018-06-30 ENCOUNTER — Telehealth (HOSPITAL_COMMUNITY): Payer: Self-pay

## 2018-06-30 NOTE — Telephone Encounter (Signed)

## 2018-07-01 ENCOUNTER — Other Ambulatory Visit: Payer: Self-pay

## 2018-07-01 ENCOUNTER — Ambulatory Visit (HOSPITAL_COMMUNITY): Payer: BC Managed Care – PPO | Attending: Cardiology

## 2018-07-01 DIAGNOSIS — R0600 Dyspnea, unspecified: Secondary | ICD-10-CM | POA: Diagnosis present

## 2018-07-04 ENCOUNTER — Ambulatory Visit (INDEPENDENT_AMBULATORY_CARE_PROVIDER_SITE_OTHER): Payer: BC Managed Care – PPO | Admitting: *Deleted

## 2018-07-04 DIAGNOSIS — I4901 Ventricular fibrillation: Secondary | ICD-10-CM

## 2018-07-04 DIAGNOSIS — I469 Cardiac arrest, cause unspecified: Secondary | ICD-10-CM

## 2018-07-05 ENCOUNTER — Telehealth: Payer: Self-pay

## 2018-07-05 LAB — CUP PACEART REMOTE DEVICE CHECK
Battery Remaining Percentage: 51 %
Date Time Interrogation Session: 20200623131044
Implantable Lead Implant Date: 20160722
Implantable Lead Location: 753862
Implantable Lead Model: 3401
Implantable Pulse Generator Implant Date: 20160722
Pulse Gen Serial Number: 112653

## 2018-07-05 NOTE — Telephone Encounter (Signed)
Left message for patient to remind of missed remote transmission.  

## 2018-07-09 ENCOUNTER — Other Ambulatory Visit: Payer: Self-pay | Admitting: Cardiology

## 2018-07-10 ENCOUNTER — Other Ambulatory Visit: Payer: Self-pay | Admitting: Cardiology

## 2018-07-11 NOTE — Telephone Encounter (Signed)
Rx(s) sent to pharmacy electronically.  

## 2018-07-11 NOTE — Progress Notes (Signed)
Remote ICD transmission.   

## 2018-08-05 ENCOUNTER — Other Ambulatory Visit: Payer: Self-pay | Admitting: Cardiology

## 2018-10-04 ENCOUNTER — Ambulatory Visit (INDEPENDENT_AMBULATORY_CARE_PROVIDER_SITE_OTHER): Payer: BC Managed Care – PPO | Admitting: *Deleted

## 2018-10-04 DIAGNOSIS — Z8674 Personal history of sudden cardiac arrest: Secondary | ICD-10-CM

## 2018-10-04 DIAGNOSIS — I4901 Ventricular fibrillation: Secondary | ICD-10-CM

## 2018-10-06 LAB — CUP PACEART REMOTE DEVICE CHECK
Battery Remaining Percentage: 47 %
Date Time Interrogation Session: 20200924093020
Implantable Lead Implant Date: 20160722
Implantable Lead Location: 753862
Implantable Lead Model: 3401
Implantable Pulse Generator Implant Date: 20160722
Pulse Gen Serial Number: 112653

## 2018-10-12 ENCOUNTER — Other Ambulatory Visit: Payer: Self-pay | Admitting: Gastroenterology

## 2018-10-12 DIAGNOSIS — E010 Iodine-deficiency related diffuse (endemic) goiter: Secondary | ICD-10-CM

## 2018-10-12 DIAGNOSIS — R1013 Epigastric pain: Secondary | ICD-10-CM

## 2018-10-12 NOTE — Progress Notes (Signed)
Remote ICD transmission.   

## 2018-10-21 ENCOUNTER — Ambulatory Visit
Admission: RE | Admit: 2018-10-21 | Discharge: 2018-10-21 | Disposition: A | Discharge status: Home / Self Care | Payer: BC Managed Care – PPO | Source: Ambulatory Visit | Attending: Gastroenterology | Admitting: Gastroenterology

## 2018-10-21 DIAGNOSIS — R1013 Epigastric pain: Secondary | ICD-10-CM

## 2019-02-10 ENCOUNTER — Ambulatory Visit (INDEPENDENT_AMBULATORY_CARE_PROVIDER_SITE_OTHER): Payer: BC Managed Care – PPO | Admitting: *Deleted

## 2019-02-10 DIAGNOSIS — I4901 Ventricular fibrillation: Secondary | ICD-10-CM | POA: Diagnosis not present

## 2019-02-12 LAB — CUP PACEART REMOTE DEVICE CHECK
Battery Remaining Percentage: 43 %
Date Time Interrogation Session: 20210129202300
Implantable Lead Implant Date: 20160722
Implantable Lead Location: 753862
Implantable Lead Model: 3401
Implantable Pulse Generator Implant Date: 20160722
Pulse Gen Serial Number: 112653

## 2019-03-10 ENCOUNTER — Other Ambulatory Visit: Payer: Self-pay | Admitting: Obstetrics and Gynecology

## 2019-03-10 DIAGNOSIS — Z1231 Encounter for screening mammogram for malignant neoplasm of breast: Secondary | ICD-10-CM

## 2019-03-21 ENCOUNTER — Ambulatory Visit
Admission: RE | Admit: 2019-03-21 | Discharge: 2019-03-21 | Disposition: A | Discharge status: Home / Self Care | Payer: BC Managed Care – PPO | Source: Ambulatory Visit | Attending: Obstetrics and Gynecology | Admitting: Obstetrics and Gynecology

## 2019-03-21 ENCOUNTER — Ambulatory Visit: Payer: BC Managed Care – PPO | Admitting: Internal Medicine

## 2019-03-21 ENCOUNTER — Encounter: Payer: Self-pay | Admitting: Internal Medicine

## 2019-03-21 ENCOUNTER — Other Ambulatory Visit: Payer: Self-pay

## 2019-03-21 VITALS — BP 110/80 | HR 79 | Ht 70.0 in | Wt 138.0 lb

## 2019-03-21 DIAGNOSIS — Z95810 Presence of automatic (implantable) cardiac defibrillator: Secondary | ICD-10-CM | POA: Diagnosis not present

## 2019-03-21 DIAGNOSIS — I4901 Ventricular fibrillation: Secondary | ICD-10-CM

## 2019-03-21 DIAGNOSIS — I201 Angina pectoris with documented spasm: Secondary | ICD-10-CM

## 2019-03-21 DIAGNOSIS — I493 Ventricular premature depolarization: Secondary | ICD-10-CM

## 2019-03-21 DIAGNOSIS — Z1231 Encounter for screening mammogram for malignant neoplasm of breast: Secondary | ICD-10-CM

## 2019-03-21 LAB — CUP PACEART INCLINIC DEVICE CHECK
Date Time Interrogation Session: 20210309181209
Implantable Lead Implant Date: 20160722
Implantable Lead Location: 753862
Implantable Lead Model: 3401
Implantable Pulse Generator Implant Date: 20160722
Pulse Gen Serial Number: 112653

## 2019-03-21 NOTE — Patient Instructions (Signed)
Medication Instructions:  Your physician recommends that you continue on your current medications as directed. Please refer to the Current Medication list given to you today.  Labwork: None ordered.  Testing/Procedures: [place] [place]- Long Term Monitor Instructions   Your physician has requested you wear your ZIO patch monitor__3_days.   This is a single patch monitor.  [employer] supplies one patch monitor per enrollment.  Additional stickers are not available.   Please do not apply patch if you will be having a Nuclear Stress Test, Echocardiogram, Cardiac CT, MRI, or Chest Xray during the time frame you would be wearing the monitor. The patch cannot be worn during these tests.  You cannot remove and re-apply the ZIO XT patch monitor.   Your ZIO patch monitor will be sent USPS Priority mail from [facility] directly to your home address. The monitor may also be mailed to a PO BOX if home delivery is not available.   It may take 3-5 days to receive your monitor after you have been enrolled.   Once you have received you monitor, please review enclosed instructions.  Your monitor has already been registered assigning a specific monitor serial # to you.   Applying the monitor   Shave hair from upper left chest.   Hold abrader disc by orange tab.  Rub abrader in 40 strokes over left upper chest as indicated in your monitor instructions.   Clean area with 4 enclosed alcohol pads .  Use all pads to assure are is cleaned thoroughly.  Let dry.   Apply patch as indicated in monitor instructions.  Patch will be place under collarbone on left side of chest with arrow pointing upward.   Rub patch adhesive wings for 2 minutes.Remove white label marked "1".  Remove white label marked "2".  Rub patch adhesive wings for 2 additional minutes.   While looking in a mirror, press and release button in center of patch.  A small green light will flash 3-4 times .  This will be your only indicator the monitor  has been turned on.     Do not shower for the first 24 hours.  You may shower after the first 24 hours.   Press button if you feel a symptom. You will hear a small click.  Record Date, Time and Symptom in the Patient Log Book.   When you are ready to remove patch, follow instructions on last 2 pages of Patient Log Book.  Stick patch monitor onto last page of Patient Log Book.   Place Patient Log Book in [place] box.  Use locking tab on box and tape box closed securely.  The [employer] and [employer] has [employer] on it.  Please place in mailbox as soon as possible.  Your physician should have your test results approximately 7 days after the monitor has been mailed back to [facility].   Call [facility] at [phone] if you have questions regarding your ZIO XT patch monitor.  Call them immediately if you see an orange light blinking on your monitor.   If your monitor falls off in less than 4 days contact our Monitor department at [phone].  If your monitor becomes loose or falls off after 4 days call [employer] at [phone] for suggestions on securing your monitor.    Follow-Up: Your physician wants you to follow-up in: 12 months with Dr Caryl Comes. You will receive a reminder letter in the mail two months in advance. If you don't receive a letter,  please call our office to schedule the follow-up appointment.  Remote monitoring is used to monitor your Pacemaker of ICD from home. This monitoring reduces the number of office visits required to check your device to one time per year. It allows [place] to keep an eye on the functioning of your device to ensure it is working properly.   Any Other Special Instructions Will Be Listed Below (If Applicable).  If you need a refill on your cardiac medications before your next appointment, please call your pharmacy.

## 2019-03-21 NOTE — Progress Notes (Signed)
Patient Care Team: Louretta Shorten, MD as PCP - General (Obstetrics and Gynecology) Michael Boston, MD as Consulting Physician (General Surgery) Leonie Man, MD as Consulting Physician (Cardiology) Deboraha Sprang, MD as Consulting Physician (Cardiology)   HPI  Jessica Collier is a 59 y.o. female Seen in follow-up for cardiac arrest related to inferior wall spasm. MRI scanning had demonstrated scarring suggesting prior episodes of spasm as well. She underwent ICD implantation for secondary prevention--subcutaneous  The patient denies chest pain, shortness of breath, nocturnal dyspnea, orthopnea or peripheral edema.  There have been no palpitations, lightheadedness or syncope.   She is back at school part time, with some ambivalence.  Her father had been living with her following a stroke, and new he is with her sisterr   DATE TEST EF   7/16 Echo   55 %   7/16 cMRI   55 % Full thickness Gad enhancement and RV apex  6/20 Echo  50-55%         Past Medical History:  Diagnosis Date  . AICD (automatic cardioverter/defibrillator) present 08/04/2014   [facility] scientific device implanted subcutaneously because of ventricular fibrillation occurring during the setting of coronary spasm   . Cardiac arrest ([place]) 07/31/2014   Related to spasm induced inferior STEMI  . 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  . Inappropriate shocks from ICD (implantable cardioverter-defibrillator) 03/05/2015  . PONV (postoperative nausea and vomiting)   . ST elevation myocardial infarction (STEMI) of inferior wall, due to SPASM 07/31/2014   July/2016 thought due to coronary spasm-normal coronary arteries noted.  Cardiac MRI confirms area of infarction in the territory of the right coronary artery.     Past Surgical History:  Procedure Laterality Date  . CARDIAC CATHETERIZATION  N/A 07/31/2014   Procedure: Left Heart Cath and Coronary Angiography;  Surgeon: Troy Sine, MD;  Location: [place] [facility];  Service: Cardiovascular;  normal coronary arteries. Normal EF  . Cardiac MRI  08/01/2014   EF 58%. Apical inferior and septal akinesis and mild hypokinesis of basal inferior and inferolateral walls; late gadolinium enhancement of mid inferior, apical inferior and septal walls involving true apex and RV apex consistent with transmural infarction in RCA/PDA territory with RV involvement. RVEF 41% = mildly decreased with dyskinesis of the apical portion.  Marland Kitchen CESAREAN SECTION     X 2  . CHOLECYSTECTOMY    . COLONOSCOPY    . ENDOMETRIAL ABLATION    . EP IMPLANTABLE DEVICE N/A 08/03/2014   Procedure: SubQ ICD Implant;  Surgeon: Deboraha Sprang, MD;  Location: [place] [facility];  Service: Cardiovascular;  Laterality: N/A;  . INSERTION OF MESH N/A 12/26/2014   Procedure: INSERTION OF MESH;  Surgeon: Michael Boston, MD;  Location: [place];  Service: General;  Laterality: N/A;  . TRANSTHORACIC ECHOCARDIOGRAM  08/01/2014    Normal wall motion. Normal diastolic function. Trivial MR  . VENTRAL HERNIA REPAIR N/A 12/26/2014   Procedure: LAPAROSCOPIC REPAIR OF PERIUMBILICAL INCISIONAL HERNIA;  Surgeon: Michael Boston, MD;  Location: [place];  Service: General;  Laterality: N/A;    Current Outpatient Medications  Medication Sig Dispense Refill  . diltiazem (CARDIZEM CD) 120 MG 24 hr capsule TAKE 1 CAPSULE BY MOUTH EVERY DAY 30 capsule 11  . isosorbide mononitrate (IMDUR) 30 MG 24 hr tablet TAKE 1 TABLET (30 MG TOTAL) BY  MOUTH DAILY. NEEDS APPOINTMENT WITH DR HARDING FOR FUTURE REFILLS 90 tablet 3  . nitroGLYCERIN (NITROSTAT) 0.4 MG SL tablet PLACE 1 TABLET (0.4 MG TOTAL) UNDER THE TONGUE EVERY 5 (FIVE) MINUTES AS NEEDED FOR CHEST PAIN. 25 tablet 3  . zolpidem (AMBIEN) 5 MG tablet TAKE 1 TABLET BY MOUTH AT BEDTIME AS NEEDED FOR SLEEP (Patient taking differently: Take 2.5 mg by mouth at  bedtime. ) 30 tablet 5   No current facility-administered medications for this visit.    No Known Allergies    Review of Systems negative except from HPI and PMH  Physical Exam BP 110/80   Pulse 79   Ht 5\' 10"  (1.778 m)   Wt 138 lb (62.6 kg)   SpO2 98%   BMI 19.80 kg/m  Well developed and well nourished in no acute distress HENT normal Neck supple with JVP-flat Clear Device pocket well healed; without hematoma or erythema.  There is no tethering  Regular rate and rhythm, no * murmur Abd-soft with active BS No Clubbing cyanosis * edema Skin-warm and dry A & Oriented  Grossly normal sensory and motor function  ECG sinus at 79 Interval 16/08/38 QR in lead V1 and V2 Low voltage limb leads Personally reviewed and unchanged from 2019  Assessment and  Plan  Coronary spasm  Cardiac arrest associated with the above  Myocardial infarction associated with the above  Abnormal ECG  ICD-S    P wave oversensing.   No interval chest pain  Inappropriate shocks for for T wave oversensing.  This was in the secondary factor.  She was reprogrammed to the primary doctor and underwent treadmill testing.  There was PVC oversensing.  Today she comes in with P wave oversensing and the alternate vector.  We have elected to reprogram her to the primary vector and will undertake monitoring to look for the PVC burden to try to assess the risk of inappropriate detection.  No intercurrent ventricular tachycardia  Without symptoms of ischemia

## 2019-03-22 ENCOUNTER — Encounter: Payer: Self-pay | Admitting: *Deleted

## 2019-03-22 NOTE — Progress Notes (Signed)
Patient ID: Jessica Collier, female   DOB: 1960-11-01, 59 y.o.   MRN: [id number] Patient enrolled for [employer] to mail a 3 day ZIO XT long term holter monitor to her home.

## 2019-03-26 ENCOUNTER — Ambulatory Visit (INDEPENDENT_AMBULATORY_CARE_PROVIDER_SITE_OTHER): Payer: BC Managed Care – PPO

## 2019-03-26 DIAGNOSIS — I493 Ventricular premature depolarization: Secondary | ICD-10-CM

## 2019-04-01 ENCOUNTER — Other Ambulatory Visit: Payer: Self-pay | Admitting: Cardiology

## 2019-05-03 ENCOUNTER — Other Ambulatory Visit: Payer: Self-pay | Admitting: Gastroenterology

## 2019-05-03 DIAGNOSIS — D1809 Hemangioma of other sites: Secondary | ICD-10-CM

## 2019-05-09 ENCOUNTER — Ambulatory Visit
Admission: RE | Admit: 2019-05-09 | Discharge: 2019-05-09 | Disposition: A | Discharge status: Home / Self Care | Payer: BC Managed Care – PPO | Source: Ambulatory Visit | Attending: Gastroenterology | Admitting: Gastroenterology

## 2019-05-09 DIAGNOSIS — D1809 Hemangioma of other sites: Secondary | ICD-10-CM

## 2019-05-12 ENCOUNTER — Telehealth: Payer: Self-pay

## 2019-05-12 ENCOUNTER — Ambulatory Visit (INDEPENDENT_AMBULATORY_CARE_PROVIDER_SITE_OTHER): Payer: BC Managed Care – PPO | Admitting: *Deleted

## 2019-05-12 DIAGNOSIS — I4901 Ventricular fibrillation: Secondary | ICD-10-CM

## 2019-05-12 NOTE — Telephone Encounter (Signed)
Spoke with patient to remind of missed remote transmission 

## 2019-05-13 LAB — CUP PACEART REMOTE DEVICE CHECK
Battery Remaining Percentage: 41 %
Date Time Interrogation Session: 20210430233800
Implantable Lead Implant Date: 20160722
Implantable Lead Location: 753862
Implantable Lead Model: 3401
Implantable Pulse Generator Implant Date: 20160722
Pulse Gen Serial Number: 112653

## 2019-05-15 ENCOUNTER — Other Ambulatory Visit: Payer: Self-pay | Admitting: Gastroenterology

## 2019-05-15 DIAGNOSIS — R9389 Abnormal findings on diagnostic imaging of other specified body structures: Secondary | ICD-10-CM

## 2019-05-15 NOTE — Progress Notes (Signed)
ICD Remote  

## 2019-05-26 ENCOUNTER — Ambulatory Visit
Admission: RE | Admit: 2019-05-26 | Discharge: 2019-05-26 | Disposition: A | Discharge status: Home / Self Care | Payer: BC Managed Care – PPO | Source: Ambulatory Visit | Attending: Gastroenterology | Admitting: Gastroenterology

## 2019-05-26 DIAGNOSIS — R9389 Abnormal findings on diagnostic imaging of other specified body structures: Secondary | ICD-10-CM

## 2019-05-26 MED ORDER — IOPAMIDOL (ISOVUE-300) INJECTION 61%
100.0000 mL | Freq: Once | INTRAVENOUS | Status: AC | PRN
  Administered 2019-05-26: 100 mL via INTRAVENOUS

## 2019-06-29 ENCOUNTER — Other Ambulatory Visit: Payer: Self-pay | Admitting: Cardiology

## 2019-08-11 ENCOUNTER — Ambulatory Visit (INDEPENDENT_AMBULATORY_CARE_PROVIDER_SITE_OTHER): Payer: BC Managed Care – PPO | Admitting: *Deleted

## 2019-08-11 DIAGNOSIS — I469 Cardiac arrest, cause unspecified: Secondary | ICD-10-CM

## 2019-08-15 LAB — CUP PACEART REMOTE DEVICE CHECK
Battery Remaining Percentage: 38 %
Date Time Interrogation Session: 20210801222000
Implantable Lead Implant Date: 20160722
Implantable Lead Location: 753862
Implantable Lead Model: 3401
Implantable Pulse Generator Implant Date: 20160722
Pulse Gen Serial Number: 112653

## 2019-08-18 NOTE — Progress Notes (Signed)
Remote ICD transmission.   

## 2019-08-22 ENCOUNTER — Other Ambulatory Visit: Payer: Self-pay | Admitting: Cardiology

## 2019-11-10 ENCOUNTER — Ambulatory Visit (INDEPENDENT_AMBULATORY_CARE_PROVIDER_SITE_OTHER): Payer: BC Managed Care – PPO

## 2019-11-10 DIAGNOSIS — Z8674 Personal history of sudden cardiac arrest: Secondary | ICD-10-CM | POA: Diagnosis not present

## 2019-11-13 LAB — CUP PACEART REMOTE DEVICE CHECK
Battery Remaining Percentage: 35 %
Date Time Interrogation Session: 20211029194500
Implantable Lead Implant Date: 20160722
Implantable Lead Location: 753862
Implantable Lead Model: 3401
Implantable Pulse Generator Implant Date: 20160722
Pulse Gen Serial Number: 112653

## 2019-11-14 NOTE — Progress Notes (Signed)
Remote ICD transmission.   

## 2020-02-09 ENCOUNTER — Ambulatory Visit (INDEPENDENT_AMBULATORY_CARE_PROVIDER_SITE_OTHER): Payer: BC Managed Care – PPO

## 2020-02-09 DIAGNOSIS — I4901 Ventricular fibrillation: Secondary | ICD-10-CM

## 2020-02-13 LAB — CUP PACEART REMOTE DEVICE CHECK
Battery Remaining Percentage: 32 %
Date Time Interrogation Session: 20220201075600
Implantable Lead Implant Date: 20160722
Implantable Lead Location: 753862
Implantable Lead Model: 3401
Implantable Pulse Generator Implant Date: 20160722
Pulse Gen Serial Number: 112653

## 2020-02-17 NOTE — Progress Notes (Signed)
Remote ICD transmission.   

## 2020-05-10 ENCOUNTER — Ambulatory Visit (INDEPENDENT_AMBULATORY_CARE_PROVIDER_SITE_OTHER): Payer: BC Managed Care – PPO

## 2020-05-10 DIAGNOSIS — Z8674 Personal history of sudden cardiac arrest: Secondary | ICD-10-CM

## 2020-05-14 LAB — CUP PACEART REMOTE DEVICE CHECK
Battery Remaining Percentage: 30 %
Date Time Interrogation Session: 20220502222100
Implantable Lead Implant Date: 20160722
Implantable Lead Location: 753862
Implantable Lead Model: 3401
Implantable Pulse Generator Implant Date: 20160722
Pulse Gen Serial Number: 112653

## 2020-05-17 ENCOUNTER — Other Ambulatory Visit: Payer: Self-pay | Admitting: Cardiology

## 2020-05-30 NOTE — Progress Notes (Signed)
Remote ICD transmission.   

## 2020-06-14 ENCOUNTER — Other Ambulatory Visit: Payer: Self-pay | Admitting: Obstetrics and Gynecology

## 2020-06-14 ENCOUNTER — Other Ambulatory Visit: Payer: Self-pay | Admitting: Internal Medicine

## 2020-06-14 DIAGNOSIS — Z1231 Encounter for screening mammogram for malignant neoplasm of breast: Secondary | ICD-10-CM

## 2020-06-14 MED ORDER — ISOSORBIDE MONONITRATE ER 30 MG PO TB24: 30.0000 mg | ORAL_TABLET | Freq: Every day | ORAL | 0 refills | Status: DC

## 2020-06-14 MED ORDER — DILTIAZEM HCL ER COATED BEADS 120 MG PO CP24: ORAL_CAPSULE | ORAL | 0 refills | Status: DC

## 2020-06-14 NOTE — Telephone Encounter (Signed)
This is Dr. Allison Quarry pt, Dr. Ellyn Hack prescribed these medications. Please address

## 2020-06-14 NOTE — Telephone Encounter (Signed)
*  STAT* If patient is at the pharmacy, call can be transferred to refill team.   1. Which medications need to be refilled? (please list name of each medication and dose if known)  diltiazem (CARDIZEM CD) 120 MG 24 hr capsule  isosorbide mononitrate (IMDUR) 30 MG 24 hr tablet    2. Which pharmacy/location (including street and city if local pharmacy) is medication to be sent to? [id number] [place], [place] [address]  3. Do they need a 30 day or 90 day supply? 90 day supply  PT has enough medication to last about a month. She has a appt on 8/22.Pharmacy told her she could not get any refills until she has a appt

## 2020-06-19 ENCOUNTER — Other Ambulatory Visit: Payer: Self-pay

## 2020-06-19 ENCOUNTER — Ambulatory Visit
Admission: RE | Admit: 2020-06-19 | Discharge: 2020-06-19 | Disposition: A | Discharge status: Home / Self Care | Payer: BC Managed Care – PPO | Source: Ambulatory Visit | Attending: Obstetrics and Gynecology | Admitting: Obstetrics and Gynecology

## 2020-06-19 DIAGNOSIS — Z1231 Encounter for screening mammogram for malignant neoplasm of breast: Secondary | ICD-10-CM

## 2020-06-27 NOTE — Progress Notes (Signed)
Patient ID: Jessica Collier, female   DOB: 10-22-1960, 60 y.o.   MRN: [id number]      Patient Care Team: Louretta Shorten, MD as PCP - General (Obstetrics and Gynecology) Michael Boston, MD as Consulting Physician (General Surgery) Leonie Man, MD as Consulting Physician (Cardiology) Deboraha Sprang, MD as Consulting Physician (Cardiology)   HPI  Jessica Collier is a 60 y.o. female Seen in follow-up for cardiac arrest related to inferior wall spasm. MRI scanning had demonstrated scarring suggesting prior episodes of spasm as well. She underwent ICD implantation for secondary prevention--subcutaneous No intercurrent shock but has a remote history of inappropriate shocks associated with T wave oversensing 2017; sensing vector reprogrammed   The patient denies chest pain,nocturnal dyspnea, orthopnea or peripheral edema. There have been no palpitations, lightheadedness or syncope.  Complains of exertional shortness of breath    She has experience mild exertional shortness of breath with physical activities in assoc she has reduced he walking activities Dyspnea assoc with physical exertion, provoked by walking up 1 flight of stairs, perfect    She retires in 2 years and husband will be retiring this year 17 Daughter is currently home for summer   DATE TEST EF   7/16 Echo   55 %   7/16 cMRI   55 % Full thickness Gad enhancement and RV apex  7/20 Echo  50-55%           Date   Cr              K         Hgb  12/16 0.74 5.0        13.2  6/20  0.73 3.8          13.6     Past Medical History:  Diagnosis Date   AICD (automatic cardioverter/defibrillator) present 08/04/2014   [employer] scientific device implanted subcutaneously because of ventricular fibrillation occurring during the setting of coronary spasm    Cardiac arrest ([place]) 07/31/2014   Related to spasm induced inferior STEMI   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  Cardiac MRI confirms area of infarction in the territory of the right coronary artery.     Past Surgical History:  Procedure Laterality Date   CARDIAC CATHETERIZATION N/A 07/31/2014   Procedure: Left Heart Cath and Coronary Angiography;  Surgeon: Troy Sine, MD;  Location: [place] [facility];  Service: Cardiovascular;  normal coronary arteries. Normal EF   Cardiac MRI  08/01/2014   EF 58%. Apical inferior and septal akinesis and mild hypokinesis of basal inferior and inferolateral walls; late gadolinium enhancement of mid inferior, apical inferior and septal walls involving true apex and RV apex consistent with transmural infarction in RCA/PDA territory with RV involvement. RVEF 41% = mildly decreased with dyskinesis of the apical portion.   CESAREAN SECTION     X 2   CHOLECYSTECTOMY     COLONOSCOPY     ENDOMETRIAL ABLATION     EP IMPLANTABLE DEVICE N/A 08/03/2014   Procedure: SubQ ICD Implant;  Surgeon: Deboraha Sprang, MD;  Location: [place] [facility];  Service:  Cardiovascular;  Laterality: N/A;   INSERTION OF MESH N/A 12/26/2014   Procedure: INSERTION OF MESH;  Surgeon: Michael Boston, MD;  Location: [place];  Service: General;  Laterality: N/A;   TRANSTHORACIC ECHOCARDIOGRAM  08/01/2014    Normal wall motion. Normal diastolic function. Trivial MR   VENTRAL HERNIA REPAIR N/A 12/26/2014   Procedure: LAPAROSCOPIC REPAIR OF PERIUMBILICAL INCISIONAL HERNIA;  Surgeon: Michael Boston, MD;  Location: [place];  Service: General;  Laterality: N/A;    Current Outpatient Medications  Medication Sig Dispense Refill   diltiazem (CARDIZEM CD) 120 MG 24 hr  capsule PATIENT MUST KEEP UPCOMING APPOINTMENT FOR FUTURE REFILLS TAKE 1 CAPSULE BY MOUTH EVERY DAY 105 capsule 0   isosorbide mononitrate (IMDUR) 30 MG 24 hr tablet Take 1 tablet (30 mg total) by mouth daily. PATIENT NEEDS TO KEEP UPCOMING APPOINTMENT FOR FUTURE REFILLS. 105 tablet 0   nitroGLYCERIN (NITROSTAT) 0.4 MG SL tablet PLACE 1 TABLET (0.4 MG TOTAL) UNDER THE TONGUE EVERY 5 (FIVE) MINUTES AS NEEDED FOR CHEST PAIN. 25 tablet 3   zolpidem (AMBIEN) 5 MG tablet TAKE 1 TABLET BY MOUTH AT BEDTIME AS NEEDED FOR SLEEP (Patient taking differently: Take 2.5 mg by mouth at bedtime. ) 30 tablet 5   No current facility-administered medications for this visit.    No Known Allergies   Physical Exam: BP 118/62   Pulse 67   Ht 5\' 10"  (1.778 m)   Wt 146 lb 3.2 oz (66.3 kg)   SpO2 96%   BMI 20.98 kg/m  Well developed and well nourished in no acute distress HENT normal Neck supple with JVP-flat Lungs Clear Device pocket well healed; without hematoma or erythema.  There is no tethering  Regular rate and rhythm, No Murmur or Gallops Abd-soft with active BS No Clubbing cyanosis  No LE edema Skin-warm and dry A & Oriented  Grossly normal sensory and motor function Grossly  EKG:  ECG:  Sinus 75 17/08/39 LAD -34 Low volts    Assessment and  Plan  Coronary spasm  Cardiac arrest associated with the above  DOE  ICD-subcutaneous  Myocardial infarction associated with the above  Mild chronic dyspnea on exertion.  No evidence of volume overload.  With her mild cardiomyopathy, she would be a candidate for an SGLT2.  She is agreeable to trying.  We will begin her on Farxiga 10 mg.  With her spasm, continue her on her Imdur 30 and diltiazem 120.  I will read review but I am inclined towards resuming statin therapy that was discontinued inadvertently a few years ago.  Reviewed her monitor.  Appears to have some sleep apnea with nocturnal sinus slowing.         I,Stephanie  Williams,acting as a Education administrator for Virl Axe, MD.,have documented all relevant documentation on the behalf of Virl Axe, MD,as directed by  Virl Axe, MD while in the presence of Virl Axe, MD.  I, Virl Axe, MD, have reviewed all documentation for this visit. The documentation on 06/28/20 for the exam, diagnosis, procedures, and orders are all accurate and complete.

## 2020-06-28 ENCOUNTER — Encounter: Payer: Self-pay | Admitting: Internal Medicine

## 2020-06-28 ENCOUNTER — Ambulatory Visit: Payer: BC Managed Care – PPO | Admitting: Internal Medicine

## 2020-06-28 ENCOUNTER — Other Ambulatory Visit: Payer: Self-pay

## 2020-06-28 VITALS — BP 118/62 | HR 67 | Ht 70.0 in | Wt 146.2 lb

## 2020-06-28 DIAGNOSIS — I4901 Ventricular fibrillation: Secondary | ICD-10-CM

## 2020-06-28 DIAGNOSIS — Z95810 Presence of automatic (implantable) cardiac defibrillator: Secondary | ICD-10-CM

## 2020-06-28 MED ORDER — DAPAGLIFLOZIN PROPANEDIOL 10 MG PO TABS
10.0000 mg | ORAL_TABLET | Freq: Every day | ORAL | 5 refills | Status: DC
Start: 2020-06-28 — End: 2021-05-23

## 2020-06-28 NOTE — Patient Instructions (Addendum)
Medication Instructions: Your physician has recommended you make the following change in your medication:  ** Begin Farxiga 10mg  - 1 tablet by mouth daily before breakfast.   *If you need a refill on your cardiac medications before your next appointment, please call your pharmacy*   Lab Work: None ordered.  If you have labs (blood work) drawn today and your tests are completely normal, you will receive your results only by: [place] (if you have MyChart) OR A paper copy in the mail If you have any lab test that is abnormal or we need to change your treatment, we will call you to review the results.   Testing/Procedures: None ordered.    Follow-Up: At [facility], you and your health needs are our priority.  As part of our continuing mission to provide you with exceptional heart care, we have created designated Provider Care Teams.  These Care Teams include your primary Cardiologist (physician) and Advanced Practice Providers (APPs -  Physician Assistants and Nurse Practitioners) who all work together to provide you with the care you need, when you need it.  We recommend signing up for the patient portal called "MyChart".  Sign up information is provided on this After Visit Summary.  MyChart is used to connect with patients for Virtual Visits (Telemedicine).  Patients are able to view lab/test results, encounter notes, upcoming appointments, etc.  Non-urgent messages can be sent to your provider as well.   To learn more about what you can do with MyChart, go to [email or web address].    Your next appointment:   3 months with Oda Kilts, PA-C  The format for your next appointment:   In Person  Provider:   Virl Axe, MD

## 2020-07-19 ENCOUNTER — Other Ambulatory Visit: Payer: Self-pay | Admitting: Cardiology

## 2020-08-09 ENCOUNTER — Ambulatory Visit (INDEPENDENT_AMBULATORY_CARE_PROVIDER_SITE_OTHER): Payer: BC Managed Care – PPO

## 2020-08-09 DIAGNOSIS — I4901 Ventricular fibrillation: Secondary | ICD-10-CM | POA: Diagnosis not present

## 2020-08-12 LAB — CUP PACEART REMOTE DEVICE CHECK
Battery Remaining Percentage: 27 %
Date Time Interrogation Session: 20220731212100
Implantable Lead Implant Date: 20160722
Implantable Lead Location: 753862
Implantable Lead Model: 3401
Implantable Pulse Generator Implant Date: 20160722
Pulse Gen Serial Number: 112653

## 2020-08-29 ENCOUNTER — Encounter: Payer: Self-pay | Admitting: Internal Medicine

## 2020-09-04 NOTE — Progress Notes (Signed)
Remote ICD transmission.   

## 2020-10-07 ENCOUNTER — Encounter: Payer: BC Managed Care – PPO | Admitting: Student

## 2020-11-08 ENCOUNTER — Ambulatory Visit (INDEPENDENT_AMBULATORY_CARE_PROVIDER_SITE_OTHER): Payer: BC Managed Care – PPO

## 2020-11-08 DIAGNOSIS — I469 Cardiac arrest, cause unspecified: Secondary | ICD-10-CM | POA: Diagnosis not present

## 2020-11-11 LAB — CUP PACEART REMOTE DEVICE CHECK
Battery Remaining Percentage: 14 %
Date Time Interrogation Session: 20221030232600
Implantable Lead Implant Date: 20160722
Implantable Lead Location: 753862
Implantable Lead Model: 3401
Implantable Pulse Generator Implant Date: 20160722
Pulse Gen Serial Number: 112653

## 2020-11-12 ENCOUNTER — Telehealth: Payer: Self-pay

## 2020-11-12 NOTE — Telephone Encounter (Signed)
Latitude alert receive ICD battery currently at 14% until ERI.  Previous report In July estimated 27% until ERI.   Industry rep contacted.   Per, Joey with [facility], Her device does have the early depletion issue.  We suggest changeout up to January 30.  She is guaranteed 6 shocks, all with a less than 15 second charge time until then.  Attempted to contact pt as she will need office visit wit Dr. Caryl Comes.  LVM with device clinic # to return the call.

## 2020-11-13 NOTE — Telephone Encounter (Signed)
I let the patient know that per the nurse note that her battery is at 14 percent. She needs an appointment with Dr. Caryl Comes to discuss a gen change. I told her the scheduler will give her a call back. 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 The scheduler can leave a message on her voicemail and she will call her back or can call her after 4:15 pm.

## 2020-11-18 NOTE — Progress Notes (Signed)
Remote ICD transmission.   

## 2020-12-03 NOTE — Progress Notes (Signed)
Electrophysiology Office Note Date: 12/03/2020  ID:  Jessica, Collier 11-28-1960, MRN [id number]  PCP: Louretta Shorten, Collier Primary Cardiologist: None Electrophysiologist: Jessica Collier   CC: Routine ICD follow-up  Jessica Collier is a 60 y.o. female seen today for Jessica Collier for routine electrophysiology followup.  Since last being seen in our clinic the patient reports doing very well.  she denies chest pain, palpitations, dyspnea, PND, orthopnea, nausea, vomiting, dizziness, syncope, edema, weight gain, or early satiety. She has not had ICD shocks.   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  Cardiac MRI confirms area of infarction in the territory of the right coronary artery.    Past Surgical History:  Procedure Laterality Date   CARDIAC CATHETERIZATION N/A 07/31/2014   Procedure: Left Heart Cath and Coronary Angiography;  Surgeon: Troy Sine, Collier;  Location: [place] [facility];  Service: Cardiovascular;  normal coronary arteries. Normal EF   Cardiac MRI  08/01/2014   EF 58%. Apical inferior and septal akinesis and mild hypokinesis of basal inferior and inferolateral  walls; late gadolinium enhancement of mid inferior, apical inferior and septal walls involving true apex and RV apex consistent with transmural infarction in RCA/PDA territory with RV involvement. RVEF 41% = mildly decreased with dyskinesis of the apical portion.   CESAREAN SECTION     X 2   CHOLECYSTECTOMY     COLONOSCOPY     ENDOMETRIAL ABLATION     EP IMPLANTABLE DEVICE N/A 08/03/2014   Procedure: SubQ ICD Implant;  Surgeon: Deboraha Sprang, Collier;  Location: [place] [facility];  Service: Cardiovascular;  Laterality: N/A;   INSERTION OF MESH N/A 12/26/2014   Procedure: INSERTION OF MESH;  Surgeon: Tonetta Napoles Boston, Collier;  Location: [place];  Service: General;  Laterality: N/A;   TRANSTHORACIC ECHOCARDIOGRAM  08/01/2014    Normal wall motion. Normal diastolic function. Trivial MR   VENTRAL HERNIA REPAIR N/A 12/26/2014   Procedure: LAPAROSCOPIC REPAIR OF PERIUMBILICAL INCISIONAL HERNIA;  Surgeon: Ecko Beasley Boston, Collier;  Location: [place];  Service: General;  Laterality: N/A;    Current Outpatient Medications  Medication Sig Dispense Refill   dapagliflozin propanediol (FARXIGA) 10 MG TABS tablet Take 1 tablet (10 mg total) by mouth daily before breakfast. 30 tablet 5   diltiazem (CARDIZEM CD) 120 MG 24 hr capsule TAKE 1 CAPSULE BY MOUTH EVERY DAY 90 capsule 3   isosorbide mononitrate (IMDUR) 30 MG 24 hr tablet Take 1 tablet (30 mg total) by mouth daily. PATIENT NEEDS TO KEEP UPCOMING APPOINTMENT FOR FUTURE REFILLS. 105 tablet 0   nitroGLYCERIN (NITROSTAT) 0.4 MG SL tablet PLACE 1 TABLET (0.4 MG TOTAL) UNDER THE TONGUE EVERY 5 (FIVE) MINUTES AS NEEDED FOR CHEST PAIN. 25 tablet 3   zolpidem (AMBIEN) 5 MG  tablet TAKE 1 TABLET BY MOUTH AT BEDTIME AS NEEDED FOR SLEEP 30 tablet 5   No current facility-administered medications for this visit.    Allergies:   Patient has no known allergies.   Social History: Social History   Socioeconomic History   Marital status: Married    Spouse name: Not on file   Number  of children: Not on file   Years of education: Not on file   Highest education level: Not on file  Occupational History   Not on file  Tobacco Use   Smoking status: Never   Smokeless tobacco: Never  Vaping Use   Vaping Use: Every day  Substance and Sexual Activity   Alcohol use: No   Drug use: No   Sexual activity: Yes  Other Topics Concern   Not on file  Social History Narrative   Not on file   Social Determinants of Health   Financial Resource Strain: Not on file  Food Insecurity: Not on file  Transportation Needs: Not on file  Physical Activity: Not on file  Stress: Not on file  Social Connections: Not on file  Intimate Partner Violence: Not on file    Family History: Family History  Problem Relation Age of Onset   Hypertension Father     Review of Systems: All other systems reviewed and are otherwise negative except as noted above.   Physical Exam: There were no vitals filed for this visit.   GEN- The patient is well appearing, alert and oriented x 3 today.   HEENT: normocephalic, atraumatic; sclera clear, conjunctiva pink; hearing intact; oropharynx clear; neck supple, no JVP Lymph- no cervical lymphadenopathy Lungs- Clear to ausculation bilaterally, normal work of breathing.  No wheezes, rales, rhonchi Heart- Regular rate and rhythm, no murmurs, rubs or gallops, PMI not laterally displaced GI- soft, non-tender, non-distended, bowel sounds present, no hepatosplenomegaly Extremities- no clubbing or cyanosis. No edema; DP/PT/radial pulses 2+ bilaterally MS- no significant deformity or atrophy Skin- warm and dry, no rash or lesion; ICD pocket well healed Psych- euthymic mood, full affect Neuro- strength and sensation are intact  ICD interrogation- reviewed in detail today,  See PACEART report  EKG:  EKG is ordered today. Personal review of EKG ordered today shows NSR at 71 bpm with normal intervals  Recent Labs: No results found for requested labs within  last 8760 hours.   Wt Readings from Last 3 Encounters:  06/28/20 146 lb 3.2 oz (66.3 kg)  03/21/19 138 lb (62.6 kg)  06/14/18 145 lb (65.8 kg)     Other studies Reviewed: Additional studies/ records that were reviewed today include: Previous EP office notes.   Assessment and Plan:  1.  Cardiac arrest s/p [employer] S-ICD  euvolemic today Stable on an appropriate medical regimen Normal ICD function See Claudia Desanctis Art report No changes today Her device needs to be replaced by 02/10/2021 per [facility] with signs of early depletion. Gen change scheduled today.   2. Coronary spasm Stable on imdur and diltiazem 120  Current medicines are reviewed at length with the patient today.    Labs/ tests ordered today include:  Orders Placed This Encounter  Procedures   Basic metabolic panel   CBC   EKG 12-Lead    Disposition:   Follow up with Dr. Caryl Comes in as usual post gen change    Signed, Shirley Friar, PA-C  12/03/2020 9:00 AM  [facility] [address] [place] [address] [phone] (office) [phone] (fax)

## 2020-12-04 ENCOUNTER — Encounter: Payer: Self-pay | Admitting: Student

## 2020-12-04 ENCOUNTER — Other Ambulatory Visit: Payer: Self-pay

## 2020-12-04 ENCOUNTER — Ambulatory Visit (INDEPENDENT_AMBULATORY_CARE_PROVIDER_SITE_OTHER): Payer: BC Managed Care – PPO | Admitting: Student

## 2020-12-04 VITALS — BP 120/68 | HR 71 | Ht 70.0 in | Wt 145.0 lb

## 2020-12-04 DIAGNOSIS — Z01818 Encounter for other preprocedural examination: Secondary | ICD-10-CM | POA: Diagnosis not present

## 2020-12-04 DIAGNOSIS — Z95810 Presence of automatic (implantable) cardiac defibrillator: Secondary | ICD-10-CM | POA: Diagnosis not present

## 2020-12-04 DIAGNOSIS — I201 Angina pectoris with documented spasm: Secondary | ICD-10-CM

## 2020-12-04 DIAGNOSIS — I469 Cardiac arrest, cause unspecified: Secondary | ICD-10-CM | POA: Diagnosis not present

## 2020-12-04 DIAGNOSIS — Z01812 Encounter for preprocedural laboratory examination: Secondary | ICD-10-CM

## 2020-12-04 LAB — CUP PACEART INCLINIC DEVICE CHECK
Date Time Interrogation Session: 20221123082750
Implantable Lead Implant Date: 20160722
Implantable Lead Location: 753862
Implantable Lead Model: 3401
Implantable Pulse Generator Implant Date: 20160722
Pulse Gen Serial Number: 112653

## 2020-12-04 NOTE — Patient Instructions (Signed)
Medication Instructions:  Your physician recommends that you continue on your current medications as directed. Please refer to the Current Medication list given to you today.  *If you need a refill on your cardiac medications before your next appointment, please call your pharmacy*   Lab Work: None ordered.  If you have labs (blood work) drawn today and your tests are completely normal, you will receive your results only by: . MyChart Message (if you have MyChart) OR . A paper copy in the mail If you have any lab test that is abnormal or we need to change your treatment, we will call you to review the results.   Testing/Procedures: None ordered.    Follow-Up: At [facility], you and your health needs are our priority.  As part of our continuing mission to provide you with exceptional heart care, we have created designated Provider Care Teams.  These Care Teams include your primary Cardiologist (physician) and Advanced Practice Providers (APPs -  Physician Assistants and Nurse Practitioners) who all work together to provide you with the care you need, when you need it.  We recommend signing up for the patient portal called "MyChart".  Sign up information is provided on this After Visit Summary.  MyChart is used to connect with patients for Virtual Visits (Telemedicine).  Patients are able to view lab/test results, encounter notes, upcoming appointments, etc.  Non-urgent messages can be sent to your provider as well.   To learn more about what you can do with MyChart, go to [email or web address].    Your next appointment:   As scheduled  

## 2020-12-05 IMAGING — CT CT ANGIO CHEST-ABD-PELV FOR DISSECTION W/ AND WO/W CM
2 of 7 series · 12 of 46 positions shown, 14 images · IV contrast (OMNI 350)
Comparison: Chest radiograph June 14, 2018. Chest CT for coronary
artery scoring January 07, 2016

CLINICAL DATA: Chest and abdominal pain with radiation toward back

EXAM:
CT ANGIOGRAPHY CHEST, ABDOMEN AND PELVIS
TECHNIQUE: Initially, axial CT images were obtained through the chest without
intravenous contrast material administration. Multidetector CT
imaging through the chest, abdomen and pelvis was performed using
the standard protocol during bolus administration of intravenous
contrast. Multiplanar reconstructed images and MIPs were obtained
and reviewed to evaluate the vascular anatomy.
CONTRAST:  100mL OMNIPAQUE IOHEXOL 350 MG/ML SOLN

[Series 7: dissection 2mm · axial · 0.68mm/px · z∈[+753,+1325]mm · 9 of 340 slices shown, 11 images]
[im 36/340  soft-tissue]
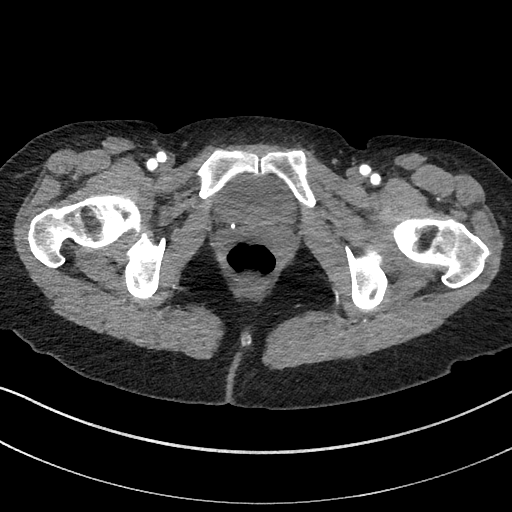
[im 36/340  bone]
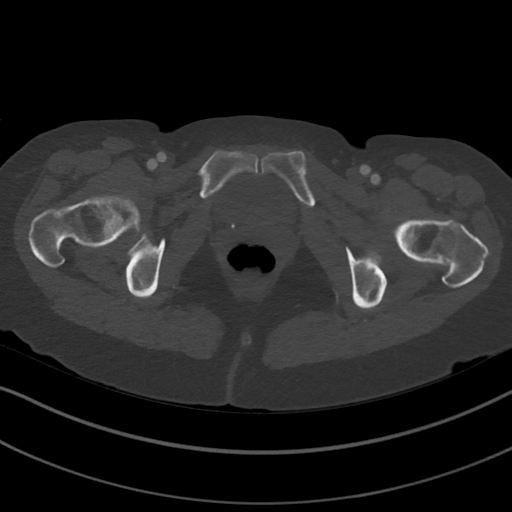
[im 72/340  soft-tissue]
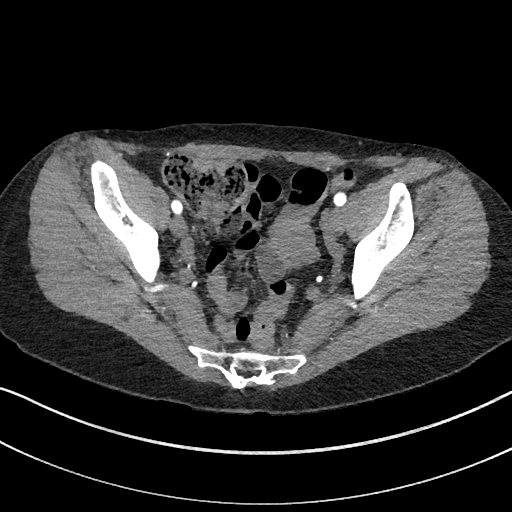
[im 108/340  soft-tissue]
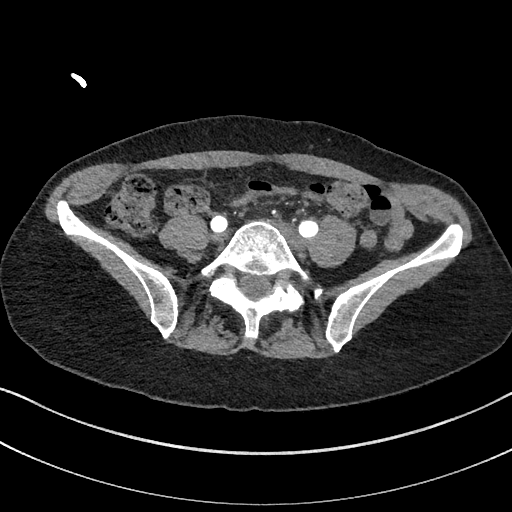
[im 143/340  soft-tissue]
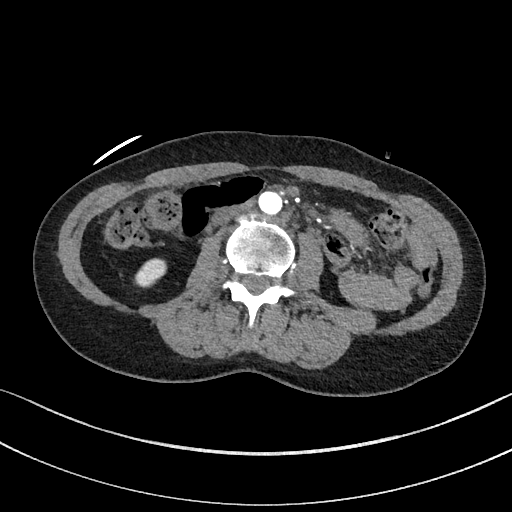
[im 179/340  soft-tissue]
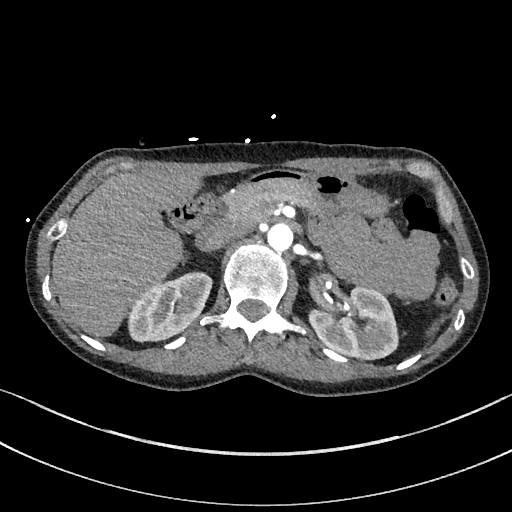
[im 215/340  soft-tissue]
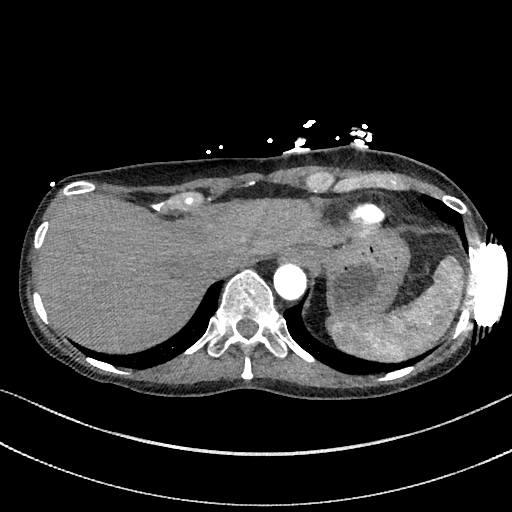
[im 250/340  soft-tissue]
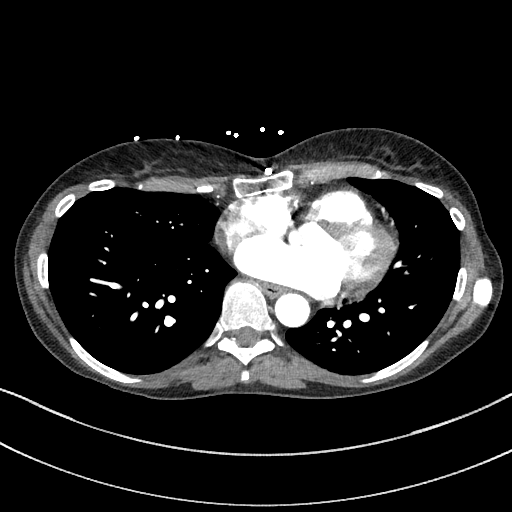
[im 286/340  soft-tissue]
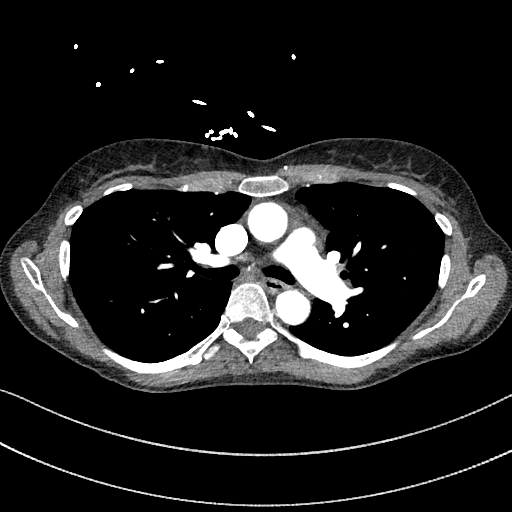
[im 322/340  soft-tissue]
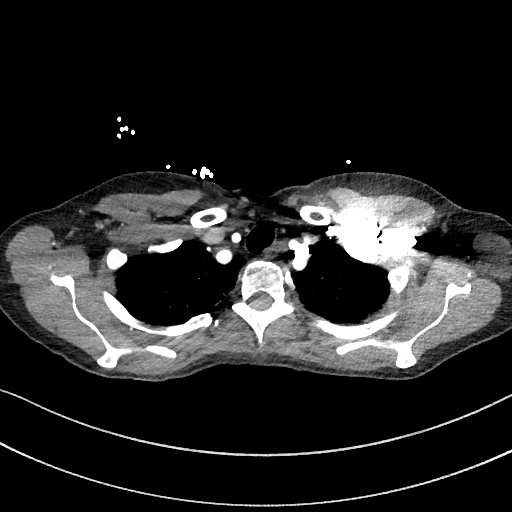
[im 322/340  bone]
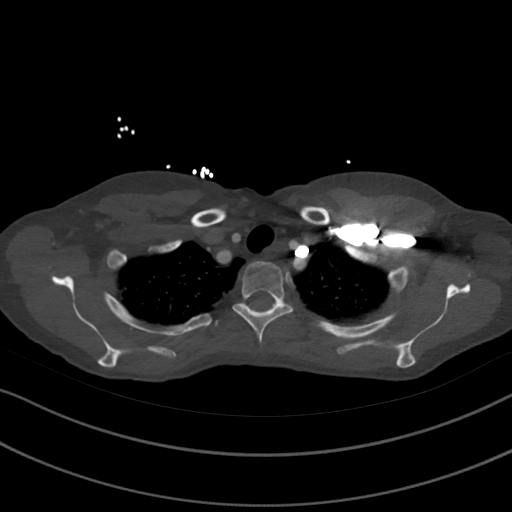

[Series 10: dissection 2mm cor · coronal · 0.74mm/px · 3 of 105 slices shown]
[im 27/105  soft-tissue]
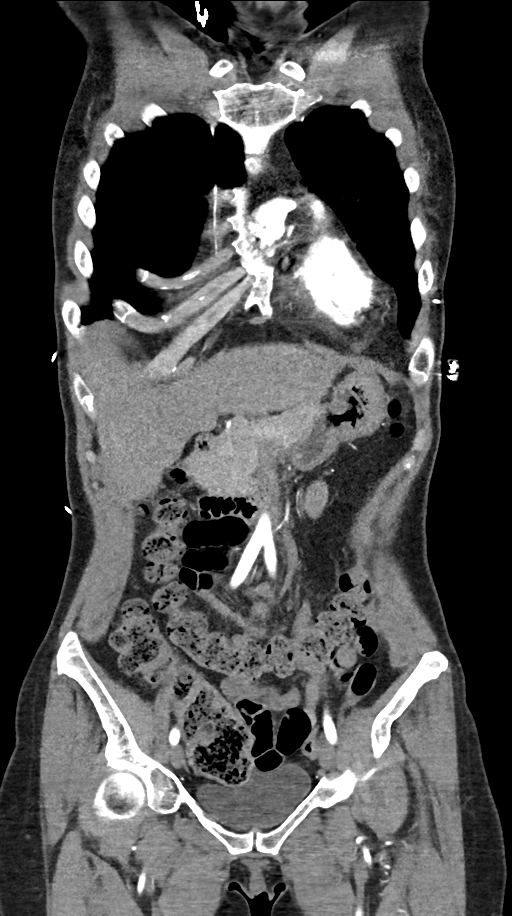
[im 53/105  soft-tissue]
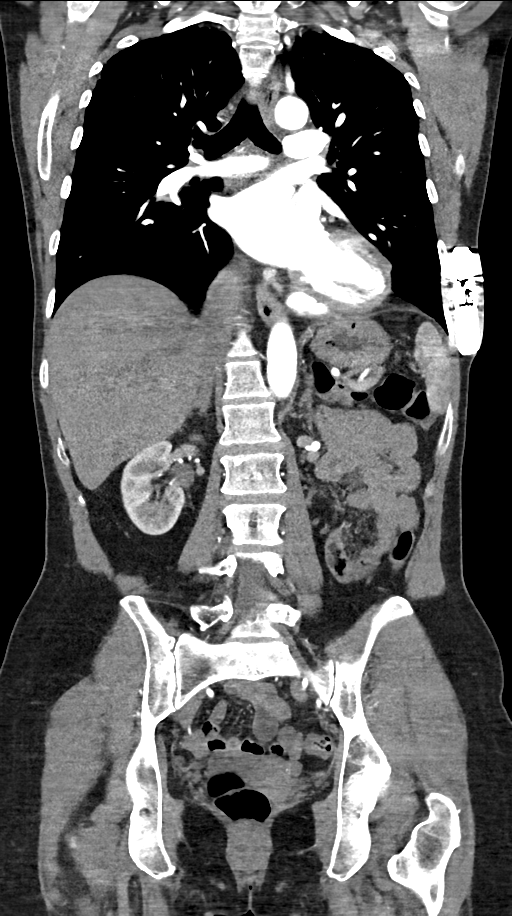
[im 79/105  soft-tissue]
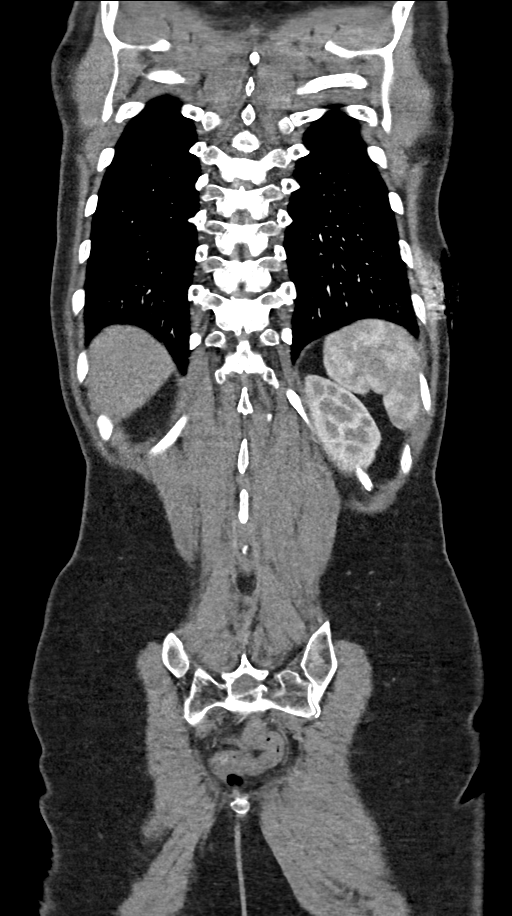

[12 of 46 positions shown; findings below may reference images not displayed]

FINDINGS: CTA CHEST FINDINGS

Cardiovascular: There is no intramural hematoma in the thoracic
aorta on the noncontrast enhanced study. There is no thoracic aortic
aneurysm or dissection. The visualized great vessels appear
unremarkable. There is no appreciable thoracic aortic
atherosclerosis or calcification. There is no demonstrable pulmonary
embolus. There is no pericardial effusion or pericardial thickening.
Pacer device is noted on the left laterally with lead anterior to
the sternum.

Mediastinum/Nodes: Visualized thyroid appears unremarkable. There is
no demonstrable thoracic adenopathy. No esophageal lesions are
appreciable.

Lungs/Pleura: There is no evident edema or consolidation. No evident
pleural effusion or pleural thickening. On axial slice 86 series 9,
there is a 3 mm nodular opacity in the posterior segment of the
right upper lobe. No new pulmonary nodular lesions evident.

Musculoskeletal: No evident blastic or lytic bone lesions. No
fracture or dislocation. No chest wall lesions. Note pacemaker
device laterally on the left along the inferior hemithorax.

Review of the MIP images confirms the above findings.

CTA ABDOMEN AND PELVIS FINDINGS

VASCULAR

Aorta: There is no abdominal aortic aneurysm or dissection. No
appreciable atherosclerosis noted.

Celiac: The celiac artery and its major branches appear widely
patent. No aneurysm or dissection. No appreciable atherosclerosis.

SMA: Superior mesenteric artery and its branches appear widely
patent. No aneurysm or dissection. No appreciable atherosclerosis.

Renals: There is a single renal artery on each side. Renal arteries
and their branches appear widely patent without appreciable
atherosclerosis. No aneurysm or dissection. No evident fibromuscular
dysplasia.

IMA: inferior mesenteric artery is quite diminutive. Inferior
mesenteric artery and its branches appear patent without
atherosclerotic plaque evident. No aneurysm or dissection involving
the inferior mesenteric artery and its branches.

Inflow: The major pelvic arterial vessels appear widely patent
without appreciable atherosclerosis. No aneurysm or dissection
involving these vessels. The proximal superficial femoral and
profunda femoral arteries appear patent without atherosclerosis. No
aneurysm or dissection in these vessels.

Veins: No obvious venous abnormality within the limitations of this
arterial phase study.

Review of the MIP images confirms the above findings.

NON-VASCULAR

Hepatobiliary: Subcentimeter cystic areas are noted in the liver. No
other liver lesions are evident on this arterial phase examination.
Gallbladder is absent. There is no appreciable biliary duct
dilatation.

Pancreas: There is no apparent pancreatic mass or inflammatory
focus.

Spleen: No splenic lesions are evident.

Adrenals/Urinary Tract: Adrenals bilaterally appear unremarkable.
Kidneys bilaterally show no evident mass or hydronephrosis on either
side. There is no evident renal or ureteral calculus on either side.
Urinary bladder is midline with wall thickness within normal limits.

Stomach/Bowel: There is no appreciable bowel wall or mesenteric
thickening. There is no evident bowel obstruction. No free air or
portal venous air. There is moderate stool throughout the colon.

Lymphatic: There is no evident adenopathy in the abdomen or pelvis.

Reproductive: Uterus is anteverted.  No evident pelvic mass.

Other: Appendix appears unremarkable. There is no evident abscess or
ascites in the abdomen or pelvis.

Musculoskeletal: No blastic or lytic bone lesions. No abdominal wall
or intramuscular lesion evident.

Review of the MIP images confirms the above findings.
IMPRESSION: CT angiogram chest:

1. No thoracic aortic aneurysm or dissection. No appreciable
thoracic aortic atherosclerosis.

2.  No demonstrable pulmonary embolus.

3. Stable 3 mm nodular opacity in the posterior segment right upper
lobe since 1474. Stability since 1474 is indicative of benign
etiology. No lung edema or consolidation.

4. Pacemaker device on the left laterally with lead anterior to the
mid sternum.

5.  No demonstrable thoracic adenopathy.

CT angiogram abdomen; CT angiogram pelvis:

1. No demonstrable aneurysm or dissection involving the aorta, major
mesenteric, and major pelvic arterial vessels. No appreciable
atherosclerosis noted. No fibromuscular dysplasia.
2. No bowel obstruction. No abscess in the abdomen or pelvis.
Appendix appears normal.
3. No evident renal or ureteral calculus. No hydronephrosis. Urinary
bladder wall thickness unremarkable.
4. Gallbladder absent.

## 2020-12-05 IMAGING — DX CHEST - 2 VIEW
2 series · 2 of 2 positions shown · non-contrast
Comparison: Chest x-ray 08/04/2014.

CLINICAL DATA: Chest pain.

EXAM:
CHEST - 2 VIEW

[chest pa]
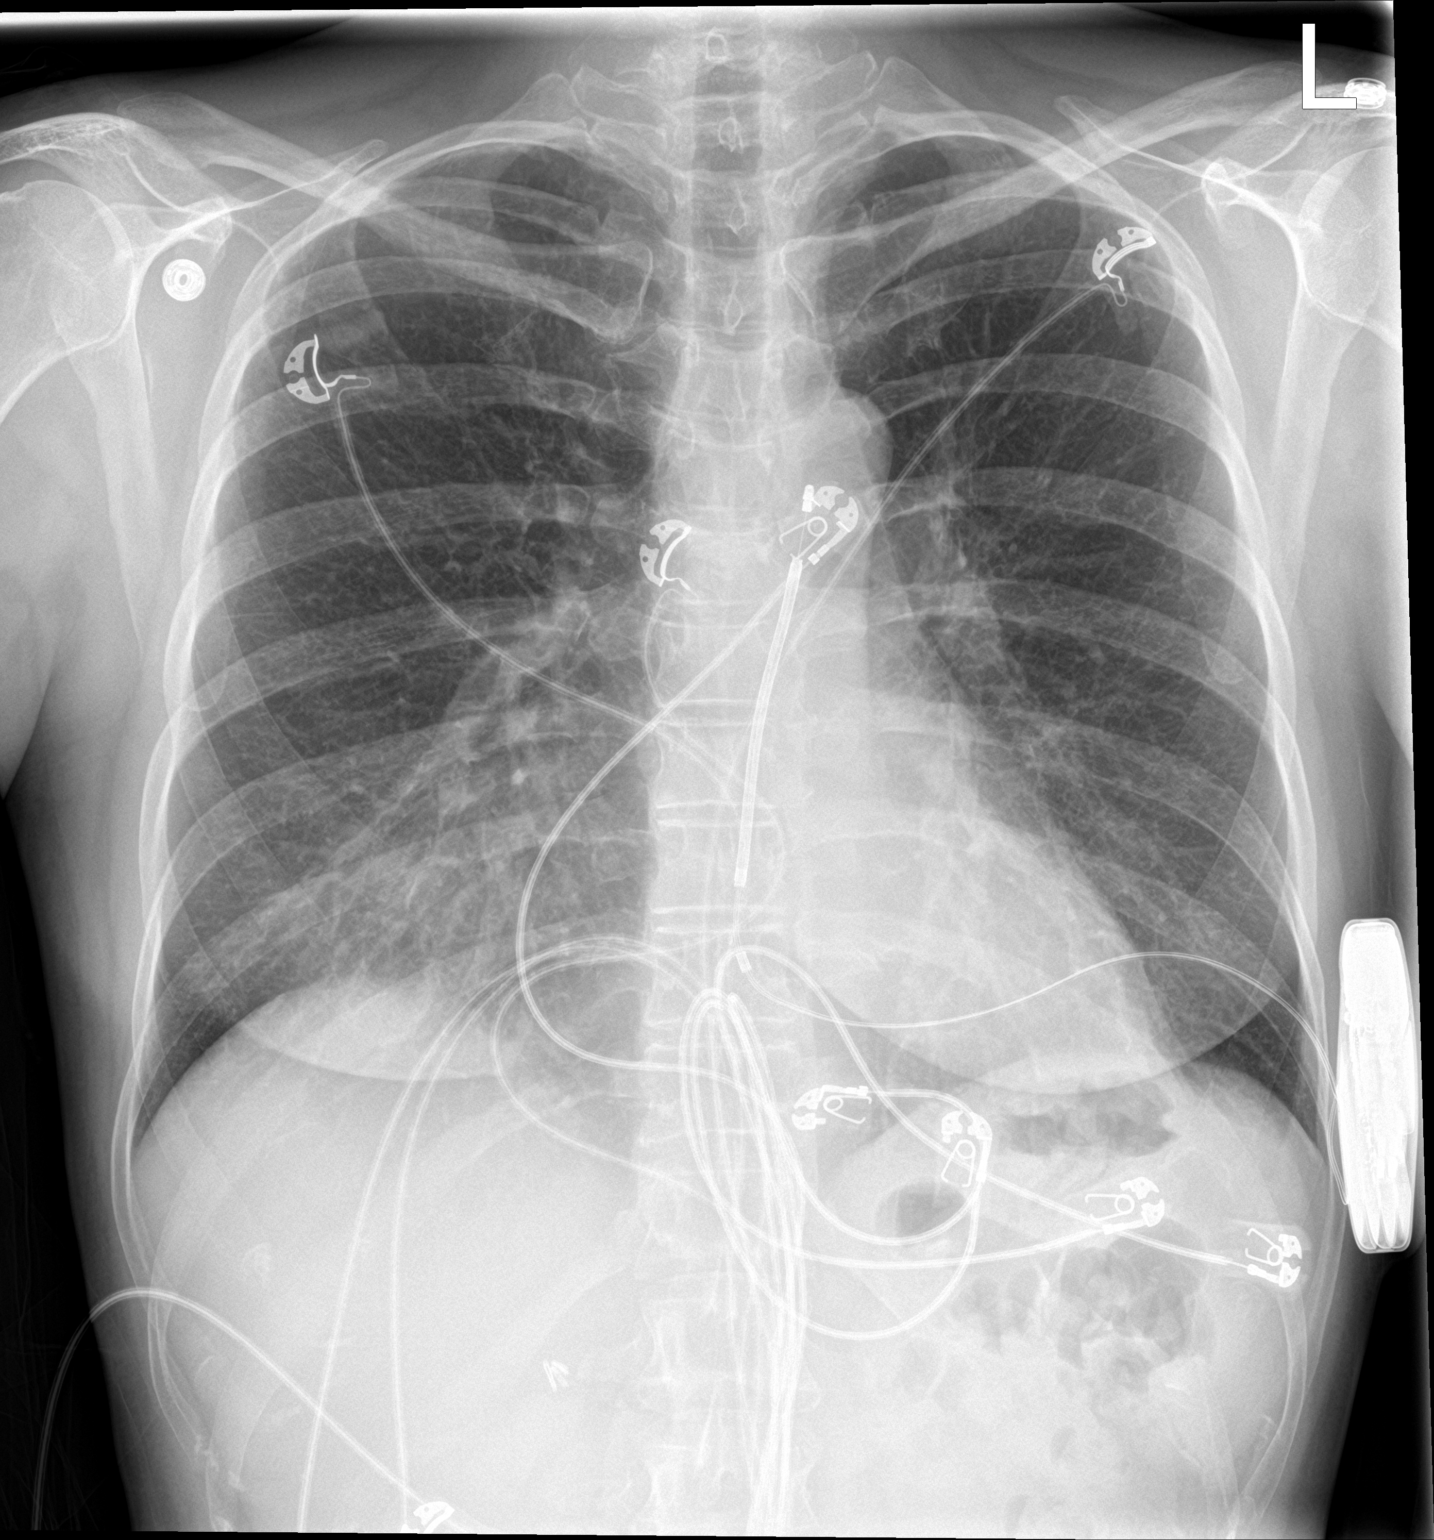

[chest lat]
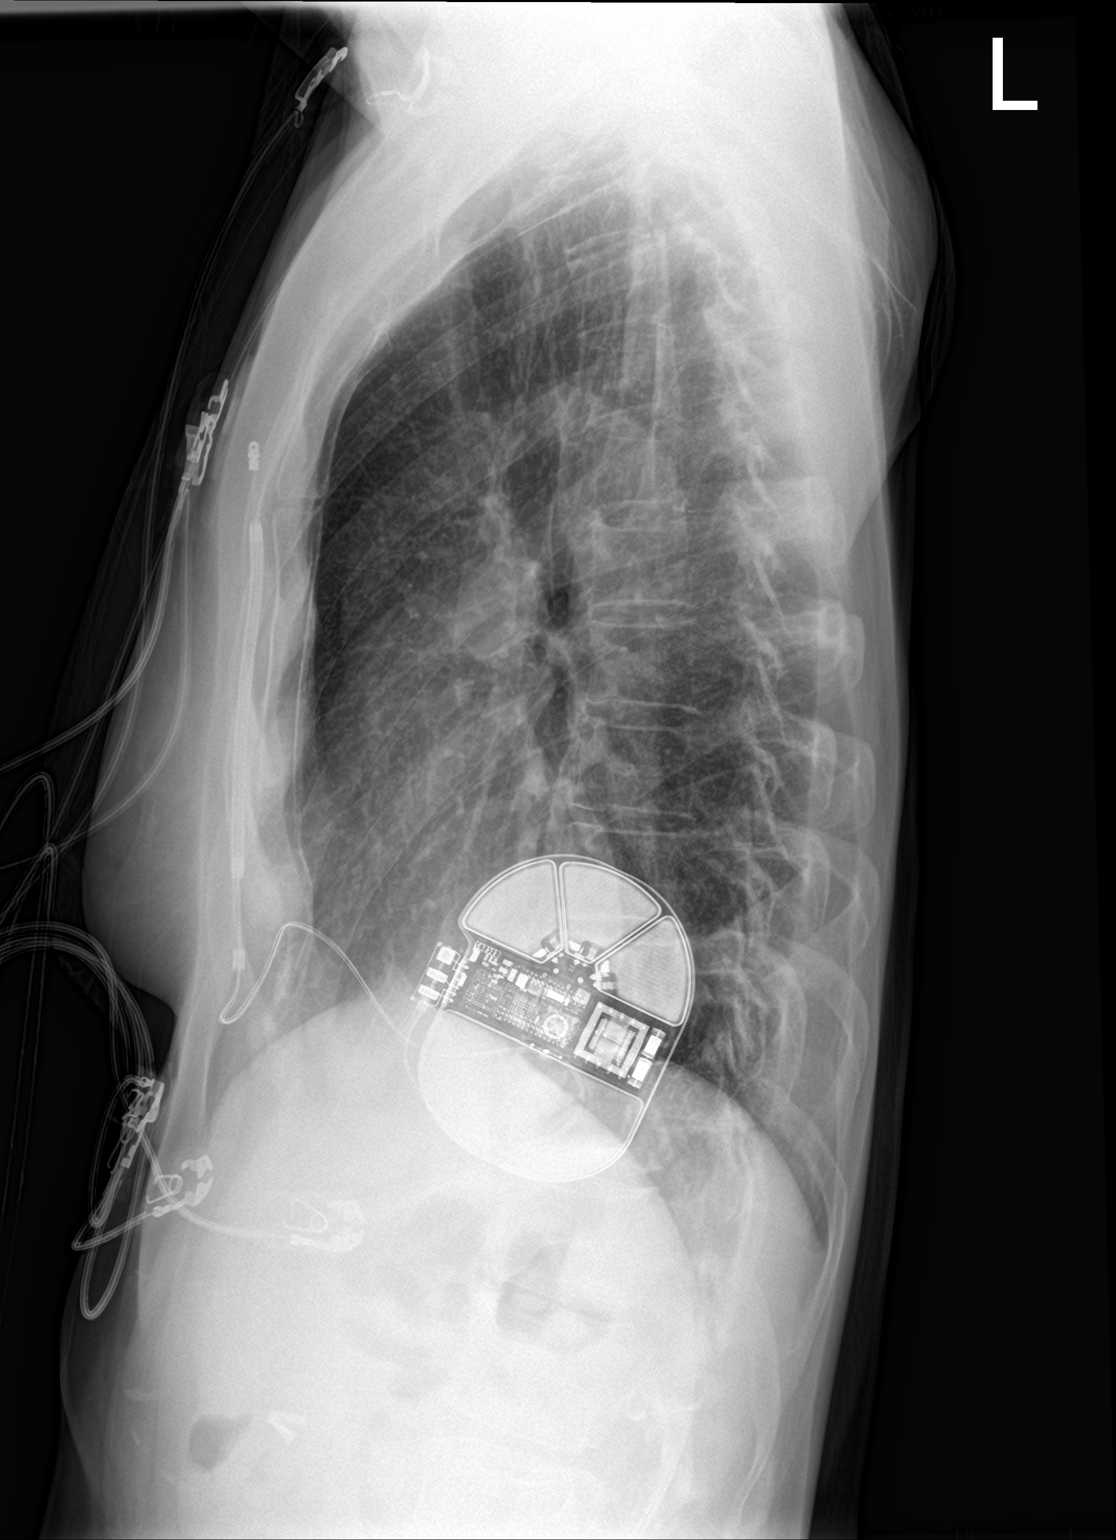

[2 of 2 positions shown; findings below may reference images not displayed]

FINDINGS: Mediastinum hilar structures normal. Cardiac defibrillator in stable
position. Heart size normal. Lungs are clear of acute infiltrates.
No pleural effusion or pneumothorax. Surgical clips right upper
quadrant. No acute bony abnormality.
IMPRESSION: No acute cardiopulmonary disease.  Chest is stable from prior exam.

## 2020-12-13 ENCOUNTER — Telehealth: Payer: Self-pay

## 2020-12-13 NOTE — Telephone Encounter (Signed)
Message sent to [employer] rep

## 2020-12-13 NOTE — Telephone Encounter (Signed)
The patient told me her device was buzzing and wanted to know if getting her [place]ita Cong will be okay for January? I had her send a transmission for the nurse to review. I told her the nurse states January should be fine. The nurse is contacting [place] and will give her a call back.

## 2020-12-13 NOTE — Telephone Encounter (Signed)
Spoke with patient.  She states device is beeping every day.  It doesn;t last long and is not bothering her.    Confirmed with [employer] rep, Joey that beeping is associated with early battery depletion.  Patient is scheduled for gen changeout on 01/17/21.  This is still acceptable.  If pt wishes to stop beeping we need to bring her into the office.  Pt is ok with device beeping, she will call if it bothers her.

## 2020-12-30 ENCOUNTER — Telehealth: Payer: Self-pay

## 2020-12-30 NOTE — Telephone Encounter (Signed)
Attempted phone call to pt to notify of arrival time change for generator change out on 01/17/2021.  OK per Epic to leave detailed VM.  Pt advised arrival time is now 830am with all other instructions to remain the same.  Pt may call [phone] for additional questions or concerns.

## 2021-01-14 ENCOUNTER — Other Ambulatory Visit: Payer: BC Managed Care – PPO | Admitting: *Deleted

## 2021-01-14 ENCOUNTER — Other Ambulatory Visit: Payer: Self-pay

## 2021-01-14 DIAGNOSIS — Z95810 Presence of automatic (implantable) cardiac defibrillator: Secondary | ICD-10-CM

## 2021-01-14 DIAGNOSIS — Z01812 Encounter for preprocedural laboratory examination: Secondary | ICD-10-CM

## 2021-01-14 DIAGNOSIS — I201 Angina pectoris with documented spasm: Secondary | ICD-10-CM

## 2021-01-14 DIAGNOSIS — I469 Cardiac arrest, cause unspecified: Secondary | ICD-10-CM

## 2021-01-14 LAB — BASIC METABOLIC PANEL
BUN/Creatinine Ratio: 25 — ABNORMAL HIGH (ref 9–23)
BUN: 16 mg/dL (ref 6–24)
CO2: 26 mmol/L (ref 20–29)
Calcium: 9.3 mg/dL (ref 8.7–10.2)
Chloride: 104 mmol/L (ref 96–106)
Creatinine, Ser: 0.65 mg/dL (ref 0.57–1.00)
Glucose: 103 mg/dL — ABNORMAL HIGH (ref 70–99)
Potassium: 4.1 mmol/L (ref 3.5–5.2)
Sodium: 140 mmol/L (ref 134–144)
eGFR: 102 mL/min/{1.73_m2} (ref >59)

## 2021-01-14 LAB — CBC
Hematocrit: 33.7 % — ABNORMAL LOW (ref 34.0–46.6)
Hemoglobin: 11.3 g/dL (ref 11.1–15.9)
MCH: 28 pg (ref 26.6–33.0)
MCHC: 33.5 g/dL (ref 31.5–35.7)
MCV: 84 fL (ref 79–97)
Platelets: 251 10*3/uL (ref 150–450)
RBC: 4.03 x10E6/uL (ref 3.77–5.28)
RDW: 13.5 % (ref 11.7–15.4)
WBC: 4.4 10*3/uL (ref 3.4–10.8)

## 2021-01-16 NOTE — Pre-Procedure Instructions (Signed)
Instructed patient on the following items: Arrival time 0830 Nothing to eat or drink after midnight No meds AM of procedure Responsible person to drive you home and stay with you for 24 hrs Wash with special soap night before and morning of procedure  

## 2021-01-17 ENCOUNTER — Other Ambulatory Visit: Payer: Self-pay

## 2021-01-17 ENCOUNTER — Ambulatory Visit (HOSPITAL_COMMUNITY)
Admission: RE | Disposition: A | Discharge status: Home / Self Care | Payer: Self-pay | Source: Home / Self Care | Attending: Internal Medicine

## 2021-01-17 ENCOUNTER — Ambulatory Visit (HOSPITAL_COMMUNITY): Payer: BC Managed Care – PPO | Admitting: Anesthesiology

## 2021-01-17 ENCOUNTER — Ambulatory Visit (HOSPITAL_COMMUNITY)
Admission: RE | Admit: 2021-01-17 | Discharge: 2021-01-17 | Disposition: A | Discharge status: Home / Self Care | Payer: BC Managed Care – PPO | Attending: Internal Medicine | Admitting: Internal Medicine

## 2021-01-17 DIAGNOSIS — R0609 Other forms of dyspnea: Secondary | ICD-10-CM | POA: Diagnosis not present

## 2021-01-17 DIAGNOSIS — I252 Old myocardial infarction: Secondary | ICD-10-CM | POA: Insufficient documentation

## 2021-01-17 DIAGNOSIS — D649 Anemia, unspecified: Secondary | ICD-10-CM | POA: Diagnosis not present

## 2021-01-17 DIAGNOSIS — I4901 Ventricular fibrillation: Secondary | ICD-10-CM | POA: Diagnosis not present

## 2021-01-17 DIAGNOSIS — Z4502 Encounter for adjustment and management of automatic implantable cardiac defibrillator: Secondary | ICD-10-CM | POA: Diagnosis not present

## 2021-01-17 HISTORY — PX: SUBQ ICD CHANGEOUT: EP1235

## 2021-01-17 LAB — FERRITIN: Ferritin: 8 ng/mL — ABNORMAL LOW (ref 11–307)

## 2021-01-17 SURGERY — SUBQ ICD CHANGEOUT
Anesthesia: General

## 2021-01-17 MED ORDER — ACETAMINOPHEN 325 MG PO TABS: 325.0000 mg | ORAL_TABLET | ORAL | Status: DC | PRN

## 2021-01-17 MED ORDER — DEXAMETHASONE SODIUM PHOSPHATE 10 MG/ML IJ SOLN
INTRAMUSCULAR | Status: DC | PRN
  Administered 2021-01-17: 10 mg via INTRAVENOUS

## 2021-01-17 MED ORDER — FENTANYL CITRATE (PF) 100 MCG/2ML IJ SOLN
INTRAMUSCULAR | Status: AC
  Filled 2021-01-17: qty 2

## 2021-01-17 MED ORDER — FENTANYL CITRATE (PF) 100 MCG/2ML IJ SOLN
INTRAMUSCULAR | Status: DC | PRN
  Administered 2021-01-17 (×2): 50 ug via INTRAVENOUS

## 2021-01-17 MED ORDER — SODIUM CHLORIDE 0.9 % IV SOLN
INTRAVENOUS | Status: AC
  Filled 2021-01-17: qty 2

## 2021-01-17 MED ORDER — ONDANSETRON HCL 4 MG/2ML IJ SOLN
INTRAMUSCULAR | Status: DC | PRN
  Administered 2021-01-17: 4 mg via INTRAVENOUS

## 2021-01-17 MED ORDER — SODIUM CHLORIDE 0.9 % IV SOLN: INTRAVENOUS | Status: AC

## 2021-01-17 MED ORDER — MIDAZOLAM HCL (PF) 5 MG/ML IJ SOLN
INTRAMUSCULAR | Status: DC | PRN
  Administered 2021-01-17: 2 mg via INTRAVENOUS

## 2021-01-17 MED ORDER — LIDOCAINE HCL (PF) 1 % IJ SOLN
INTRAMUSCULAR | Status: DC | PRN
  Administered 2021-01-17: 60 mL

## 2021-01-17 MED ORDER — SODIUM CHLORIDE 0.9 % IV SOLN: INTRAVENOUS | Status: DC

## 2021-01-17 MED ORDER — PHENYLEPHRINE HCL-NACL 20-0.9 MG/250ML-% IV SOLN
INTRAVENOUS | Status: DC | PRN
  Administered 2021-01-17: 20 ug/min via INTRAVENOUS

## 2021-01-17 MED ORDER — MIDAZOLAM HCL 5 MG/5ML IJ SOLN
INTRAMUSCULAR | Status: AC
  Filled 2021-01-17: qty 5

## 2021-01-17 MED ORDER — CEFAZOLIN SODIUM-DEXTROSE 2-4 GM/100ML-% IV SOLN
2.0000 g | INTRAVENOUS | Status: AC
  Administered 2021-01-17: 2 g via INTRAVENOUS

## 2021-01-17 MED ORDER — CEFAZOLIN SODIUM-DEXTROSE 2-4 GM/100ML-% IV SOLN
INTRAVENOUS | Status: AC
  Filled 2021-01-17: qty 100

## 2021-01-17 MED ORDER — SODIUM CHLORIDE 0.9 % IV SOLN
80.0000 mg | INTRAVENOUS | Status: AC
  Administered 2021-01-17: 80 mg

## 2021-01-17 MED ORDER — EPHEDRINE SULFATE-NACL 50-0.9 MG/10ML-% IV SOSY
PREFILLED_SYRINGE | INTRAVENOUS | Status: DC | PRN
  Administered 2021-01-17 (×2): 5 mg via INTRAVENOUS

## 2021-01-17 MED ORDER — PROPOFOL 10 MG/ML IV BOLUS
INTRAVENOUS | Status: DC | PRN
  Administered 2021-01-17: 200 mg via INTRAVENOUS

## 2021-01-17 MED ORDER — CHLORHEXIDINE GLUCONATE 4 % EX LIQD
4.0000 "application " | Freq: Once | CUTANEOUS | Status: DC
  Filled 2021-01-17: qty 60

## 2021-01-17 MED ORDER — LIDOCAINE HCL 1 % IJ SOLN
INTRAMUSCULAR | Status: AC
  Filled 2021-01-17: qty 80

## 2021-01-17 MED ORDER — LIDOCAINE 2% (20 MG/ML) 5 ML SYRINGE
INTRAMUSCULAR | Status: DC | PRN
  Administered 2021-01-17: 100 mg via INTRAVENOUS

## 2021-01-17 MED ORDER — POVIDONE-IODINE 10 % EX SWAB
2.0000 "application " | Freq: Once | CUTANEOUS | Status: AC
  Administered 2021-01-17: 2 via TOPICAL

## 2021-01-17 SURGICAL SUPPLY — 5 items
CABLE SURGICAL S-101-97-12 (CABLE) ×2 IMPLANT
HEMOSTAT SURGICEL 2X4 FIBR (HEMOSTASIS) ×2 IMPLANT
ICD SUBCU MRI EMBLEM A219 (ICD Generator) ×1 IMPLANT
PAD DEFIB RADIO PHYSIO CONN (PAD) ×2 IMPLANT
TRAY PACEMAKER INSERTION (PACKS) ×2 IMPLANT

## 2021-01-17 NOTE — Discharge Instructions (Addendum)
Post procedure wound care instructions May shower in 24 hours No driving for 2 days. No vigorous activities until wound healed Call the office [phone]) for redness, drainage, swelling, or fever.  Implantable Cardiac Device Battery Change, Care After  This sheet gives you information about how to care for yourself after your procedure. Your health care provider may also give you more specific instructions. If you have problems or questions, contact your health care provider. What can I expect after the procedure? After your procedure, it is common to have: Pain or soreness at the site where the cardiac device was inserted. Swelling at the site where the cardiac device was inserted. You should received an information card for your new device in 4-8 weeks. Follow these instructions at home: Incision care  Keep the incision clean and dry. Do not take baths, swim, or use a hot tub until after your wound check.  Do not shower for at least 7 days, or as directed by your health care provider. Pat the area dry with a clean towel. Do not rub the area. This may cause bleeding. Follow instructions from your health care provider about how to take care of your incision. Make sure you: Leave stitches (sutures), skin glue, or adhesive strips in place. These skin closures may need to stay in place for 2 weeks or longer. If adhesive strip edges start to loosen and curl up, you may trim the loose edges. Do not remove adhesive strips completely unless your health care provider tells you to do that. Check your incision area every day for signs of infection. Check for: More redness, swelling, or pain. More fluid or blood. Warmth. Pus or a bad smell. Activity Do not lift anything that is heavier than 10 lb (4.5 kg) until your health care provider says it is okay to do so. For the first week, or as long as told by your health care provider: Avoid lifting your affected arm higher than your shoulder. After 1  week, Be gentle when you move your arms over your head. It is okay to raise your arm to comb your hair. Avoid strenuous exercise. Ask your health care provider when it is okay to: Resume your normal activities. Return to work or school. Resume sexual activity. Eating and drinking Eat a heart-healthy diet. This should include plenty of fresh fruits and vegetables, whole grains, low-fat dairy products, and lean protein like chicken and fish. Limit alcohol intake to no more than 1 drink a day for non-pregnant women and 2 drinks a day for men. One drink equals 12 oz of beer, 5 oz of wine, or 1 oz of hard liquor. Check ingredients and nutrition facts on packaged foods and beverages. Avoid the following types of food: Food that is high in salt (sodium). Food that is high in saturated fat, like full-fat dairy or red meat. Food that is high in trans fat, like fried food. Food and drinks that are high in sugar. Lifestyle Do not use any products that contain nicotine or tobacco, such as cigarettes and e-cigarettes. If you need help quitting, ask your health care provider. Take steps to manage and control your weight. Once cleared, get regular exercise. Aim for 150 minutes of moderate-intensity exercise (such as walking or yoga) or 75 minutes of vigorous exercise (such as running or swimming) each week. Manage other health problems, such as diabetes or high blood pressure. Ask your health care provider how you can manage these conditions. General instructions Do not drive for  24 hours after your procedure if you were given a medicine to help you relax (sedative). Take over-the-counter and prescription medicines only as told by your health care provider. Avoid putting pressure on the area where the cardiac device was placed. If you need an MRI after your cardiac device has been placed, be sure to tell the health care provider who orders the MRI that you have a cardiac device. Avoid close and prolonged  exposure to electrical devices that have strong magnetic fields. These include: Cell phones. Avoid keeping them in a pocket near the cardiac device, and try using the ear opposite the cardiac device. MP3 players. Household appliances, like microwaves. Metal detectors. Electric generators. High-tension wires. Keep all follow-up visits as directed by your health care provider. This is important. Contact a health care provider if: You have pain at the incision site that is not relieved by over-the-counter or prescription medicines. You have any of these around your incision site or coming from it: More redness, swelling, or pain. Fluid or blood. Warmth to the touch. Pus or a bad smell. You have a fever. You feel brief, occasional palpitations, light-headedness, or any symptoms that you think might be related to your heart. Get help right away if: You experience chest pain that is different from the pain at the cardiac device site. You develop a red streak that extends above or below the incision site. You experience shortness of breath. You have palpitations or an irregular heartbeat. You have light-headedness that does not go away quickly. You faint or have dizzy spells. Your pulse suddenly drops or increases rapidly and does not return to normal. You begin to gain weight and your legs and ankles swell. Summary After your procedure, it is common to have pain, soreness, and some swelling where the cardiac device was inserted. Make sure to keep your incision clean and dry. Follow instructions from your health care provider about how to take care of your incision. Check your incision every day for signs of infection, such as more pain or swelling, pus or a bad smell, warmth, or leaking fluid and blood. Avoid strenuous exercise and lifting your left arm higher than your shoulder for 2 weeks, or as long as told by your health care provider. This information is not intended to replace advice  given to you by your health care provider. Make sure you discuss any questions you have with your health care provider.

## 2021-01-17 NOTE — Anesthesia Procedure Notes (Signed)
Procedure Name: LMA Insertion Date/Time: 01/17/2021 11:02 AM Performed by: Vonna Drafts, CRNA Pre-anesthesia Checklist: Patient identified, Emergency Drugs available, Suction available, Patient being monitored and Timeout performed Patient Re-evaluated:Patient Re-evaluated prior to induction Oxygen Delivery Method: Circle system utilized Preoxygenation: Pre-oxygenation with 100% oxygen Induction Type: IV induction Ventilation: Mask ventilation without difficulty LMA: LMA inserted LMA Size: 4.0 Number of attempts: 1 Tube secured with: Tape Dental Injury: Teeth and Oropharynx as per pre-operative assessment

## 2021-01-17 NOTE — Anesthesia Preprocedure Evaluation (Signed)
Anesthesia Evaluation  Patient identified by MRN, date of birth, ID band Patient awake    Reviewed: Allergy & Precautions, NPO status , Patient's Chart, lab work & pertinent test results  History of Anesthesia Complications Negative for: history of anesthetic complications  Airway Mallampati: II  TM Distance: >3 FB Neck ROM: Full    Dental  (+) Dental Advisory Given, Teeth Intact   Pulmonary neg pulmonary ROS,    Pulmonary exam normal        Cardiovascular + Past MI (d/t suspected coronary vasospasm)  Normal cardiovascular exam+ dysrhythmias Ventricular Fibrillation + Cardiac Defibrillator    Echo 2020: EF 50-55%, normal RVSF, normal valves  LHC 2016: normal LVSF, normal coronary arteries; Suspect possible transient RCA vasospasm in the etiology of her transient inferior ST elevation with probable perfusion mediated ventricular arrhythmia.   Neuro/Psych negative neurological ROS     GI/Hepatic negative GI ROS, Neg liver ROS,   Endo/Other  negative endocrine ROS  Renal/GU negative Renal ROS  negative genitourinary   Musculoskeletal negative musculoskeletal ROS (+)   Abdominal   Peds  Hematology negative hematology ROS (+)   Anesthesia Other Findings   Reproductive/Obstetrics                            Anesthesia Physical Anesthesia Plan  ASA: 3  Anesthesia Plan: General   Post-op Pain Management: Minimal or no pain anticipated   Induction: Intravenous  PONV Risk Score and Plan: 3 and Ondansetron, Dexamethasone, Treatment may vary due to age or medical condition and Midazolam  Airway Management Planned: Oral ETT  Additional Equipment: None  Intra-op Plan:   Post-operative Plan: Extubation in OR  Informed Consent: I have reviewed the patients History and Physical, chart, labs and discussed the procedure including the risks, benefits and alternatives for the proposed anesthesia  with the patient or authorized representative who has indicated his/her understanding and acceptance.     Dental advisory given  Plan Discussed with:   Anesthesia Plan Comments:         Anesthesia Quick Evaluation

## 2021-01-17 NOTE — Transfer of Care (Signed)
Immediate Anesthesia Transfer of Care Note  Patient: Jessica Collier  Procedure(s) Performed: [place]  Patient Location: PACU and Cath Lab  Anesthesia Type:General  Level of Consciousness: drowsy  Airway & Oxygen Therapy: Patient Spontanous Breathing and Patient connected to nasal cannula oxygen  Post-op Assessment: Report given to RN and Post -op Vital signs reviewed and stable  Post vital signs: Reviewed and stable  Last Vitals:  Vitals Value Taken Time  BP 131/66 01/17/21 1229  Temp    Pulse 76 01/17/21 1231  Resp 13 01/17/21 1231  SpO2 100 % 01/17/21 1231  Vitals shown include unvalidated device data.  Last Pain:  Vitals:   01/17/21 0854  TempSrc: Oral         Complications: No notable events documented.

## 2021-01-17 NOTE — H&P (Signed)
Patient Care Team: Louretta Shorten, MD as PCP - General (Obstetrics and Gynecology) Deboraha Sprang, MD as PCP - Electrophysiology (Cardiology) Michael Boston, MD as Consulting Physician (General Surgery) Leonie Man, MD as Consulting Physician (Cardiology) Deboraha Sprang, MD as Consulting Physician (Cardiology)   HPI  Jessica Collier is a 61 y.o. female admitted for ICD -SUBQ change following implant 2016 for aborted cardiac arrest in the setting of spasm assoc STEMI  The patient denies chest pain, shortness of breath, nocturnal dyspnea, orthopnea or peripheral edema.  There have been no palpitations, lightheadedness or syncope.    DATE TEST EF    7/16 Echo   55 %    7/16 cMRI   55 % Full thickness Gad enhancement and RV apex  7/20 Echo  50-55%              Date   Cr              K         Hgb  12/16 0.74 5.0 13.2  6/20  0.73 3.8   13.6  1/23 0.65 4.1 11.3    Records and Results Reviewed   Past Medical History:  Diagnosis Date   AICD (automatic cardioverter/defibrillator) present 08/04/2014   [facility] scientific device implanted subcutaneously because of ventricular fibrillation occurring during the setting of coronary spasm    Cardiac arrest ([place]) 07/31/2014   Related to spasm induced inferior STEMI   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  Cardiac MRI confirms area of infarction in the territory of the right coronary artery.     Past Surgical History:  Procedure Laterality Date   CARDIAC CATHETERIZATION N/A 07/31/2014   Procedure: Left Heart Cath and  Coronary Angiography;  Surgeon: Troy Sine, MD;  Location: [place] [facility];  Service: Cardiovascular;  normal coronary arteries. Normal EF   Cardiac MRI  08/01/2014   EF 58%. Apical inferior and septal akinesis and mild hypokinesis of basal inferior and inferolateral walls; late gadolinium enhancement of mid inferior, apical inferior and septal walls involving true apex and RV apex consistent with transmural infarction in RCA/PDA territory with RV involvement. RVEF 41% = mildly decreased with dyskinesis of the apical portion.   CESAREAN SECTION     X 2   CHOLECYSTECTOMY     COLONOSCOPY     ENDOMETRIAL ABLATION     EP IMPLANTABLE DEVICE N/A 08/03/2014   Procedure: SubQ ICD Implant;  Surgeon: Deboraha Sprang, MD;  Location: [facility];  Service: Cardiovascular;  Laterality: N/A;   INSERTION OF MESH N/A 12/26/2014   Procedure: INSERTION OF MESH;  Surgeon: Michael Boston, MD;  Location: [facility];  Service: General;  Laterality: N/A;   TRANSTHORACIC ECHOCARDIOGRAM  08/01/2014    Normal wall motion. Normal diastolic function. Trivial MR   VENTRAL HERNIA REPAIR N/A 12/26/2014   Procedure: LAPAROSCOPIC REPAIR OF PERIUMBILICAL INCISIONAL HERNIA;  Surgeon: Michael Boston, MD;  Location: [facility];  Service: General;  Laterality: N/A;    Current Facility-Administered Medications  Medication Dose Route Frequency Provider Last Rate Last Admin  0.9 %  sodium chloride infusion   Intravenous Continuous Deboraha Sprang, MD 50 mL/hr at 01/17/21 0920 New Bag at 01/17/21 0920   ceFAZolin (ANCEF) IVPB 2g/100 mL premix  2 g Intravenous On Call Deboraha Sprang, MD       chlorhexidine (HIBICLENS) 4 % liquid 4 application  4 application Topical Once Deboraha Sprang, MD       gentamicin (GARAMYCIN) 80 mg in sodium chloride 0.9 % 500 mL irrigation  80 mg Irrigation On Call Deboraha Sprang, MD        No Known Allergies    Social History   Tobacco Use   Smoking status: Never   Smokeless tobacco: Never   Vaping Use   Vaping Use: Every day  Substance Use Topics   Alcohol use: No   Drug use: No     Family History  Problem Relation Age of Onset   Hypertension Father      Current Meds  Medication Sig   Cholecalciferol (VITAMIN D3 PO) Take 1 tablet by mouth daily.   diltiazem (CARDIZEM CD) 120 MG 24 hr capsule TAKE 1 CAPSULE BY MOUTH EVERY DAY (Patient taking differently: 120 mg at bedtime.)   isosorbide mononitrate (IMDUR) 30 MG 24 hr tablet Take 1 tablet (30 mg total) by mouth daily. PATIENT NEEDS TO KEEP UPCOMING APPOINTMENT FOR FUTURE REFILLS. (Patient taking differently: Take 30 mg by mouth at bedtime. PATIENT NEEDS TO KEEP UPCOMING APPOINTMENT FOR FUTURE REFILLS.)   nitroGLYCERIN (NITROSTAT) 0.4 MG SL tablet PLACE 1 TABLET (0.4 MG TOTAL) UNDER THE TONGUE EVERY 5 (FIVE) MINUTES AS NEEDED FOR CHEST PAIN.   zolpidem (AMBIEN) 5 MG tablet TAKE 1 TABLET BY MOUTH AT BEDTIME AS NEEDED FOR SLEEP     Review of Systems negative except from HPI and PMH  Physical Exam BP 120/64    Pulse 72    Temp (!) 97.4 F (36.3 C) (Oral)    Resp 15    Ht 5\' 10"  (1.778 m)    Wt 63.5 kg    SpO2 100%    BMI 20.09 kg/m  Well developed and well nourished in no acute distress HENT normal E scleral and icterus clear Neck Supple JVP flat; carotids brisk and full Clear to ausculation Regular rate and rhythm, no murmurs gallops or rub Soft with active bowel sounds No clubbing cyanosis  Edema Alert and oriented, grossly normal motor and sensory function Skin Warm and Dry    Assessment and  Plan  Coronary spasm   Cardiac arrest associated with the above   DOE   ICD-subcutaneous   Myocardial infarction associated with the above  Anemia    For ICD generator change. We have reviewed the benefits and risks of generator replacement.  These include but are not limited to lead fracture and infection.  The patient understands, agrees and is willing to proceed.    Still with dyspnea

## 2021-01-17 NOTE — Anesthesia Postprocedure Evaluation (Signed)
Anesthesia Post Note  Patient: Jessica Collier  Procedure(s) Performed: [place]     Patient location during evaluation: PACU Anesthesia Type: General Level of consciousness: awake and alert Pain management: pain level controlled Vital Signs Assessment: post-procedure vital signs reviewed and stable Respiratory status: spontaneous breathing, nonlabored ventilation and respiratory function stable Cardiovascular status: blood pressure returned to baseline and stable Postop Assessment: no apparent nausea or vomiting Anesthetic complications: no   No notable events documented.  Last Vitals:  Vitals:   01/17/21 1315 01/17/21 1330  BP: (!) 106/54 106/61  Pulse: 72 (!) 59  Resp: 19 13  Temp:    SpO2: 100% 100%    Last Pain:  Vitals:   01/17/21 1316  TempSrc:   PainSc: 0-No pain                 Lidia Collum

## 2021-01-18 ENCOUNTER — Other Ambulatory Visit: Payer: Self-pay | Admitting: Internal Medicine

## 2021-01-20 ENCOUNTER — Encounter (HOSPITAL_COMMUNITY): Payer: Self-pay | Admitting: Internal Medicine

## 2021-01-20 MED FILL — Gentamicin Sulfate Inj 40 MG/ML: INTRAMUSCULAR | Qty: 80 | Status: AC

## 2021-01-29 ENCOUNTER — Ambulatory Visit: Payer: BC Managed Care – PPO

## 2021-01-30 ENCOUNTER — Ambulatory Visit: Payer: BC Managed Care – PPO | Admitting: Student

## 2021-01-30 ENCOUNTER — Other Ambulatory Visit: Payer: Self-pay

## 2021-01-30 DIAGNOSIS — I469 Cardiac arrest, cause unspecified: Secondary | ICD-10-CM | POA: Diagnosis not present

## 2021-01-30 LAB — CUP PACEART INCLINIC DEVICE CHECK
Date Time Interrogation Session: 20230119085644
Implantable Lead Implant Date: 20160722
Implantable Lead Location: 753862
Implantable Lead Model: 3401
Implantable Pulse Generator Implant Date: 20230109
Pulse Gen Serial Number: 170756

## 2021-01-30 NOTE — Progress Notes (Signed)
Wound check Subcutaneous ICD in clinic. Excess dermabond removed. Small "lip" on inferior portion of incision. Well healing tissue. No drainage or bleeding. 0 episodes. Electrode impedance status okay. No programming changes. Remaining longevity to ERI 100%.

## 2021-03-11 ENCOUNTER — Other Ambulatory Visit: Payer: Self-pay

## 2021-03-11 DIAGNOSIS — R600 Localized edema: Secondary | ICD-10-CM

## 2021-03-11 NOTE — Progress Notes (Signed)
Per Dr Caryl Comes, please arrange for venous doppler of the left leg ASAP for lower leg swelling. Order placed and message sent to [facility] and precert for scheduling.

## 2021-03-12 ENCOUNTER — Ambulatory Visit (HOSPITAL_COMMUNITY)
Admission: RE | Admit: 2021-03-12 | Discharge: 2021-03-12 | Disposition: A | Discharge status: Home / Self Care | Payer: BC Managed Care – PPO | Source: Ambulatory Visit | Attending: Cardiovascular Disease | Admitting: Cardiovascular Disease

## 2021-03-12 ENCOUNTER — Other Ambulatory Visit: Payer: Self-pay

## 2021-03-12 DIAGNOSIS — R600 Localized edema: Secondary | ICD-10-CM

## 2021-03-21 ENCOUNTER — Other Ambulatory Visit: Payer: Self-pay | Admitting: Sports Medicine

## 2021-03-21 DIAGNOSIS — M5020 Other cervical disc displacement, unspecified cervical region: Secondary | ICD-10-CM

## 2021-03-26 ENCOUNTER — Other Ambulatory Visit (HOSPITAL_BASED_OUTPATIENT_CLINIC_OR_DEPARTMENT_OTHER): Payer: Self-pay | Admitting: Sports Medicine

## 2021-03-26 DIAGNOSIS — M5020 Other cervical disc displacement, unspecified cervical region: Secondary | ICD-10-CM

## 2021-03-28 ENCOUNTER — Telehealth (HOSPITAL_BASED_OUTPATIENT_CLINIC_OR_DEPARTMENT_OTHER): Payer: Self-pay

## 2021-03-28 ENCOUNTER — Other Ambulatory Visit: Payer: Self-pay

## 2021-03-28 ENCOUNTER — Encounter (HOSPITAL_BASED_OUTPATIENT_CLINIC_OR_DEPARTMENT_OTHER): Payer: Self-pay

## 2021-03-28 ENCOUNTER — Ambulatory Visit (HOSPITAL_BASED_OUTPATIENT_CLINIC_OR_DEPARTMENT_OTHER)
Admission: RE | Admit: 2021-03-28 | Discharge: 2021-03-28 | Disposition: A | Discharge status: Home / Self Care | Payer: BC Managed Care – PPO | Source: Ambulatory Visit | Attending: Sports Medicine | Admitting: Sports Medicine

## 2021-03-28 ENCOUNTER — Other Ambulatory Visit (HOSPITAL_BASED_OUTPATIENT_CLINIC_OR_DEPARTMENT_OTHER): Payer: Self-pay | Admitting: Sports Medicine

## 2021-03-28 DIAGNOSIS — M5020 Other cervical disc displacement, unspecified cervical region: Secondary | ICD-10-CM

## 2021-03-31 ENCOUNTER — Ambulatory Visit (HOSPITAL_BASED_OUTPATIENT_CLINIC_OR_DEPARTMENT_OTHER)
Admission: RE | Admit: 2021-03-31 | Discharge: 2021-03-31 | Disposition: A | Discharge status: Home / Self Care | Payer: BC Managed Care – PPO | Source: Ambulatory Visit | Attending: Sports Medicine | Admitting: Sports Medicine

## 2021-03-31 ENCOUNTER — Other Ambulatory Visit: Payer: Self-pay

## 2021-03-31 DIAGNOSIS — M5020 Other cervical disc displacement, unspecified cervical region: Secondary | ICD-10-CM | POA: Insufficient documentation

## 2021-04-16 ENCOUNTER — Other Ambulatory Visit: Payer: BC Managed Care – PPO

## 2021-04-25 ENCOUNTER — Encounter: Payer: BC Managed Care – PPO | Admitting: Internal Medicine

## 2021-04-28 ENCOUNTER — Ambulatory Visit (INDEPENDENT_AMBULATORY_CARE_PROVIDER_SITE_OTHER): Payer: BC Managed Care – PPO

## 2021-04-28 DIAGNOSIS — I469 Cardiac arrest, cause unspecified: Secondary | ICD-10-CM | POA: Diagnosis not present

## 2021-04-29 LAB — CUP PACEART REMOTE DEVICE CHECK
Battery Remaining Percentage: 99 %
Date Time Interrogation Session: 20230415184800
Implantable Lead Implant Date: 20160722
Implantable Lead Location: 753862
Implantable Lead Model: 3401
Implantable Pulse Generator Implant Date: 20230109
Pulse Gen Serial Number: 170756

## 2021-05-14 NOTE — Progress Notes (Signed)
Remote ICD transmission.   

## 2021-05-23 ENCOUNTER — Encounter: Payer: Self-pay | Admitting: Student

## 2021-05-23 ENCOUNTER — Telehealth: Payer: Self-pay | Admitting: Internal Medicine

## 2021-05-23 ENCOUNTER — Ambulatory Visit (INDEPENDENT_AMBULATORY_CARE_PROVIDER_SITE_OTHER): Payer: BC Managed Care – PPO | Admitting: Student

## 2021-05-23 VITALS — BP 110/62 | HR 80 | Ht 70.0 in | Wt 142.0 lb

## 2021-05-23 DIAGNOSIS — Z95810 Presence of automatic (implantable) cardiac defibrillator: Secondary | ICD-10-CM | POA: Diagnosis not present

## 2021-05-23 DIAGNOSIS — R600 Localized edema: Secondary | ICD-10-CM

## 2021-05-23 DIAGNOSIS — I4901 Ventricular fibrillation: Secondary | ICD-10-CM

## 2021-05-23 MED ORDER — FUROSEMIDE 20 MG PO TABS
20.0000 mg | ORAL_TABLET | Freq: Every day | ORAL | 3 refills | Status: DC | PRN
Start: 2021-05-23 — End: 2021-11-03

## 2021-05-23 NOTE — Patient Instructions (Signed)
Medication Instructions:  ?Your physician has recommended you make the following change in your medication:  ? ?START: Furosemide '20mg'$  as needed for edema ? ?*If you need a refill on your cardiac medications before your next appointment, please call your pharmacy* ? ? ?Lab Work: ?TODAY: BMET, CBC, BNP ? ?If you have labs (blood work) drawn today and your tests are completely normal, you will receive your results only by: ?MyChart Message (if you have MyChart) OR ?A paper copy in the mail ?If you have any lab test that is abnormal or we need to change your treatment, we will call you to review the results. ? ? ?Testing/Procedures: ?Your physician has requested that you have an echocardiogram. Echocardiography is a painless test that uses sound waves to create images of your heart. It provides your doctor with information about the size and shape of your heart and how well your heart?s chambers and valves are working. This procedure takes approximately one hour. There are no restrictions for this procedure. ? ? ?Follow-Up: ?At [facility], you and your health needs are our priority.  As part of our continuing mission to provide you with exceptional heart care, we have created designated Provider Care Teams.  These Care Teams include your primary Cardiologist (physician) and Advanced Practice Providers (APPs -  Physician Assistants and Nurse Practitioners) who all work together to provide you with the care you need, when you need it. ? ?Your next appointment:   ?As scheduled  ?

## 2021-05-23 NOTE — Telephone Encounter (Signed)
Pt c/o swelling: STAT is pt has developed SOB within 24 hours ? ?How much weight have you gained and in what time span?  ?Patient states she hasn't checked her weight ? ?If swelling, where is the swelling located?  ?Only the left left leg--painful, became worse over the past few weeks  ? ?Are you currently taking a fluid pill?  ?No  ? ?Are you currently SOB?  ?No  ? ?Do you have a log of your daily weights (if so, list)?  ?No log available  ? ?Have you gained 3 pounds in a day or 5 pounds in a week?  ?No  ? ?Have you traveled recently?  ?No  ? ?

## 2021-05-23 NOTE — Progress Notes (Addendum)
? ?PCP:  Louretta Shorten, MD ?Primary Cardiologist: None ?Electrophysiologist: Virl Axe, MD  ? ?Jessica Collier is a 61 y.o. female seen today for Virl Axe, MD for add on for peripheral edema for past several months.  Has waxed and waned, became painful and more swollen again over the past few weeks. Previous work up unremarkable.  ? ?She has ankle edema that is worse in the evening. She has non radiating pain on her left medial knee, and ? Radiating pain from hip to inner left thigh.  She denies any specific aggravating or relieving factors, other than edema is worse in the evenings. Today, it is minimal. No pain with ambulation.  ? ?Past Medical History:  ?Diagnosis Date  ? AICD (automatic cardioverter/defibrillator) present 08/04/2014  ? [employer] device implanted subcutaneously because of ventricular fibrillation occurring during the setting of coronary spasm   ? Cardiac arrest ([place]) 07/31/2014  ? Related to spasm induced inferior STEMI  ? 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  ? Inappropriate shocks from ICD (implantable cardioverter-defibrillator) 03/05/2015  ? PONV (postoperative nausea and vomiting)   ? ST elevation myocardial infarction (STEMI) of inferior wall, due to SPASM 07/31/2014  ? July/2016 thought due to coronary spasm-normal coronary arteries noted.  Cardiac MRI confirms area of infarction in the territory of the right coronary artery.   ? ?Past Surgical History:  ?Procedure Laterality Date  ? CARDIAC CATHETERIZATION N/A 07/31/2014  ? Procedure: Left Heart Cath and Coronary Angiography;  Surgeon: Troy Sine, MD;  Location: [place] [facility];  Service: Cardiovascular;  normal coronary arteries. Normal EF  ? Cardiac MRI  08/01/2014  ? EF 58%. Apical inferior and septal akinesis and mild hypokinesis of basal inferior and inferolateral walls; late gadolinium  enhancement of mid inferior, apical inferior and septal walls involving true apex and RV apex consistent with transmural infarction in RCA/PDA territory with RV involvement. RVEF 41% = mildly decreased with dyskinesis of the apical portion.  ? CESAREAN SECTION    ? X 2  ? CHOLECYSTECTOMY    ? COLONOSCOPY    ? ENDOMETRIAL ABLATION    ? EP IMPLANTABLE DEVICE N/A 08/03/2014  ? Procedure: SubQ ICD Implant;  Surgeon: Deboraha Sprang, MD;  Location: [place] [facility];  Service: Cardiovascular;  Laterality: N/A;  ? INSERTION OF MESH N/A 12/26/2014  ? Procedure: INSERTION OF MESH;  Surgeon: Verlon Carcione [place], MD;  Location: [place];  Service: General;  Laterality: N/A;  ? SUBQ ICD CHANGEOUT N/A 01/17/2021  ? Procedure: SUBQ ICD CHANGEOUT;  Surgeon: Deboraha Sprang, MD;  Location: [place] [facility];  Service: Cardiovascular;  Laterality: N/A;  ? TRANSTHORACIC ECHOCARDIOGRAM  08/01/2014   ? Normal wall motion. Normal diastolic function. Trivial MR  ? VENTRAL HERNIA REPAIR N/A 12/26/2014  ? Procedure: LAPAROSCOPIC REPAIR OF PERIUMBILICAL INCISIONAL HERNIA;  Surgeon: Coni Homesley [place], MD;  Location: [place];  Service: General;  Laterality: N/A;  ? ? ?Current Outpatient Medications  ?Medication Sig Dispense Refill  ? Cholecalciferol (VITAMIN D3 PO) Take 1 tablet by mouth daily.    ? diltiazem (CARDIZEM CD) 120 MG 24 hr capsule TAKE 1 CAPSULE BY MOUTH EVERY DAY 90 capsule 3  ? furosemide (LASIX) 20 MG tablet Take 1 tablet (20 mg total) by mouth daily as needed. 90 tablet 3  ? isosorbide mononitrate (IMDUR) 30 MG 24 hr tablet Take 1  tablet (30 mg total) by mouth daily. 90 tablet 2  ? nitroGLYCERIN (NITROSTAT) 0.4 MG SL tablet PLACE 1 TABLET (0.4 MG TOTAL) UNDER THE TONGUE EVERY 5 (FIVE) MINUTES AS NEEDED FOR CHEST PAIN. 25 tablet 3  ? zolpidem (AMBIEN) 5 MG tablet TAKE 1 TABLET BY MOUTH AT BEDTIME AS NEEDED FOR SLEEP 30 tablet 5  ? ?No current facility-administered medications for this visit.  ? ? ?No Known Allergies ? ?Social History   ? ?Socioeconomic History  ? Marital status: Married  ?  Spouse name: Not on file  ? Number of children: Not on file  ? Years of education: Not on file  ? Highest education level: Not on file  ?Occupational History  ? Not on file  ?Tobacco Use  ? Smoking status: Never  ? Smokeless tobacco: Never  ?Vaping Use  ? Vaping Use: Every day  ?Substance and Sexual Activity  ? Alcohol use: No  ? Drug use: No  ? Sexual activity: Yes  ?Other Topics Concern  ? Not on file  ?Social History Narrative  ? Not on file  ? ?Social Determinants of Health  ? ?Financial Resource Strain: Not on file  ?Food Insecurity: Not on file  ?Transportation Needs: Not on file  ?Physical Activity: Not on file  ?Stress: Not on file  ?Social Connections: Not on file  ?Intimate Partner Violence: Not on file  ? ? ? ?Review of Systems: ?All other systems reviewed and are otherwise negative except as noted above. ? ?Physical Exam: ?Vitals:  ? 05/23/21 1215  ?BP: 110/62  ?Pulse: 80  ?SpO2: 98%  ?Weight: 142 lb (64.4 kg)  ?Height: '5\' 10"'$  (1.778 m)  ? ? ?GEN- The patient is well appearing, alert and oriented x 3 today.   ?HEENT: normocephalic, atraumatic; sclera clear, conjunctiva pink; hearing intact; oropharynx clear; neck supple, no JVP ?Lymph- no cervical lymphadenopathy ?Lungs- Clear to ausculation bilaterally, normal work of breathing.  No wheezes, rales, rhonchi ?Heart- Regular rate and rhythm, no murmurs, rubs or gallops, PMI not laterally displaced ?GI- soft, non-tender, non-distended, bowel sounds present, no hepatosplenomegaly ?Extremities- no clubbing and cyanosis; Left trace ankle edema. DP/PT/radial pulses 2+ bilaterally ?MS- no significant deformity or atrophy ?Skin- warm and dry, no rash or lesion ?Psych- euthymic mood, full affect ?Neuro- strength and sensation are intact ? ?EKG is not ordered.  ? ?Additional studies reviewed include: ?Previous EP office notes.  ? ?Assessment and Plan: ? ?1.  Cardiac arrest s/p [employer] S-ICD  ?Stable  on an appropriate medical regimen ?Normal ICD by most recent transmission. She has 91 day check next month.  ?No changes today ?  ?2. Coronary spasm ?Stable on imdur and diltiazem 120 ? ?3. Left ankle edema ?4. Left knee pain, medial ?5. Left thigh pain, at times radiating into the hip ?Edema is L > R, waxing and waning over past several months. ?DVT US 03/12/2021 unremarkable.  ?Most likely, this is musculoskeletal in origin.  ?Offered complete cardiac work up for completeness, and in shared decision making decided to proceed so that attention can be turned away from cardiac etiology.  ?BMET, CBC, BNP today. Update Echo for completeness.  ?Will give 20 mg lasix to use as needed, but as above, low suspicion of cardiac etiology.  ?She did not have pain with varus or valgus stress of the knee in the supine position, though the test was performed with only partial flexion of the knee  ?She does have mild lumbar degenerative changes on CT 04/01/2021. ?  Recommend that she follow up with PCP -> Ortho.   ? ?Her S-ICD IS MRI compatible should work up require it.  ? ?Follow up with Dr. Caryl Comes as scheduled as she is s/p gen change and to recheck on her edema.  ? ?Shirley Friar, PA-C  ?05/23/21 ?12:36 PM  ?

## 2021-05-23 NOTE — Telephone Encounter (Signed)
Pt continues to complain of lower left leg swelling.  Negative doppler on 03/12/2021 for DVT.  Pt requesting appointment as no improvement in symptoms and leg is now painful.  Appointment scheduled with Oda Kilts, PA-C for today at 1220pm.  Pt verbalizes understanding and agrees with current plan. ?

## 2021-05-24 LAB — BASIC METABOLIC PANEL
BUN/Creatinine Ratio: 19 (ref 9–23)
BUN: 11 mg/dL (ref 6–24)
CO2: 23 mmol/L (ref 20–29)
Calcium: 9.3 mg/dL (ref 8.7–10.2)
Chloride: 106 mmol/L (ref 96–106)
Creatinine, Ser: 0.58 mg/dL (ref 0.57–1.00)
Glucose: 90 mg/dL (ref 70–99)
Potassium: 3.9 mmol/L (ref 3.5–5.2)
Sodium: 142 mmol/L (ref 134–144)
eGFR: 105 mL/min/{1.73_m2} (ref >59)

## 2021-05-24 LAB — CBC
Hematocrit: 34.9 % (ref 34.0–46.6)
Hemoglobin: 11.7 g/dL (ref 11.1–15.9)
MCH: 27.7 pg (ref 26.6–33.0)
MCHC: 33.5 g/dL (ref 31.5–35.7)
MCV: 83 fL (ref 79–97)
Platelets: 212 10*3/uL (ref 150–450)
RBC: 4.23 x10E6/uL (ref 3.77–5.28)
RDW: 13.9 % (ref 11.7–15.4)
WBC: 4.9 10*3/uL (ref 3.4–10.8)

## 2021-05-24 LAB — PRO B NATRIURETIC PEPTIDE: NT-Pro BNP: 151 pg/mL (ref 0–287)

## 2021-05-27 ENCOUNTER — Other Ambulatory Visit: Payer: Self-pay

## 2021-05-27 DIAGNOSIS — R600 Localized edema: Secondary | ICD-10-CM

## 2021-06-11 ENCOUNTER — Ambulatory Visit (HOSPITAL_COMMUNITY): Payer: BC Managed Care – PPO | Attending: Cardiology

## 2021-06-11 DIAGNOSIS — I4901 Ventricular fibrillation: Secondary | ICD-10-CM

## 2021-06-11 DIAGNOSIS — R600 Localized edema: Secondary | ICD-10-CM

## 2021-06-11 LAB — ECHOCARDIOGRAM COMPLETE
Area-P 1/2: 3.28 cm2
S' Lateral: 3.1 cm

## 2021-06-17 ENCOUNTER — Encounter: Payer: Self-pay | Admitting: Internal Medicine

## 2021-06-17 ENCOUNTER — Ambulatory Visit: Payer: BC Managed Care – PPO | Admitting: Internal Medicine

## 2021-06-17 VITALS — BP 108/66 | HR 69 | Ht 70.0 in | Wt 140.4 lb

## 2021-06-17 DIAGNOSIS — I4901 Ventricular fibrillation: Secondary | ICD-10-CM | POA: Diagnosis not present

## 2021-06-17 DIAGNOSIS — Z8674 Personal history of sudden cardiac arrest: Secondary | ICD-10-CM | POA: Diagnosis not present

## 2021-06-17 DIAGNOSIS — Z95810 Presence of automatic (implantable) cardiac defibrillator: Secondary | ICD-10-CM | POA: Diagnosis not present

## 2021-06-17 LAB — CUP PACEART INCLINIC DEVICE CHECK
Date Time Interrogation Session: 20230606161228
Implantable Lead Implant Date: 20160722
Implantable Lead Location: 753862
Implantable Lead Model: 3401
Implantable Pulse Generator Implant Date: 20230109
Pulse Gen Serial Number: 170756

## 2021-06-17 NOTE — Progress Notes (Signed)
Jessica Collier, if you have a minute can you come by pod B tomorrow morning i.e. Wednesday I have a quick question for you thanks Patient ID: Jessica Collier, female   DOB: [birth year], 61 y.o.   MRN: [id number]      Patient Care Team: Louretta Shorten, MD as PCP - General (Obstetrics and Gynecology) Deboraha Sprang, MD as PCP - Electrophysiology (Cardiology) Michael [place], MD as Consulting Physician (General Surgery) Leonie Man, MD as Consulting Physician (Cardiology) Deboraha Sprang, MD as Consulting Physician (Cardiology)   HPI  Jessica Collier is a 61 y.o. female Seen in follow-up for cardiac arrest related to inferior wall spasm. MRI scanning had demonstrated scarring suggesting prior episodes of spasm as well. She underwent ICD implantation for secondary prevention--subcutaneous; generator replacement 1/23  No intercurrent shock but has a remote history of inappropriate shocks associated with T wave oversensing 2017; sensing vector reprogrammed   The patient denies chest pain, shortness of breath, nocturnal dyspnea, orthopnea    There have been no palpitations, lightheadedness or syncope.   Swelling of her left leg dated March 2023, venous Dopplers was unrevealing   She was seen by AT for dyspnea couple weeks ago.  BNP was normal no complaints of dyspnea at this time    She has decided to retire   DATE TEST EF   7/16 Echo   55 %   7/16 cMRI   55 % Full thickness Gad enhancement and RV apex  7/20 Echo  50-55%   5/23 Echo  50-55%           Date   Cr              K Hgb  12/16 0.74 5.0 13.2  6/20  0.73 3.8   13.6  5/23 0.58 3.9 11.7  Ferritin 8 (1/23)     Past Medical History:  Diagnosis Date   AICD (automatic cardioverter/defibrillator) present 08/04/2014   [facility] scientific device implanted subcutaneously because of ventricular fibrillation occurring during the setting of coronary spasm    Cardiac arrest ([place]) 07/31/2014   Related to spasm induced inferior STEMI   Coronary  artery spasm ([place]) 08/01/2014   Likely recurrent RCA spasm leading to transmural infarction of the distal RCA-PDA territory involving the apex and apical RV.   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  Cardiac MRI confirms area of infarction in the territory of the right coronary artery.     Past Surgical History:  Procedure Laterality Date   CARDIAC CATHETERIZATION N/A 07/31/2014   Procedure: Left Heart Cath and Coronary Angiography;  Surgeon: Troy Sine, MD;  Location: [place] [facility];  Service: Cardiovascular;  normal coronary arteries. Normal EF   Cardiac MRI  08/01/2014   EF 58%. Apical inferior and septal akinesis and mild hypokinesis of basal inferior and inferolateral walls; late gadolinium enhancement of mid inferior, apical inferior and septal walls involving true apex and RV apex consistent with transmural infarction in RCA/PDA territory with RV involvement. RVEF 41% = mildly decreased with dyskinesis of the apical portion.   CESAREAN SECTION     X 2   CHOLECYSTECTOMY     COLONOSCOPY     ENDOMETRIAL ABLATION     EP IMPLANTABLE DEVICE N/A 08/03/2014   Procedure: SubQ ICD Implant;  Surgeon: Deboraha Sprang, MD;  Location: [place] [facility];  Service: Cardiovascular;  Laterality: N/A;   INSERTION OF MESH N/A 12/26/2014   Procedure: INSERTION OF MESH;  Surgeon: Michael [place], MD;  Location: [place];  Service: General;  Laterality: N/A;   SUBQ ICD CHANGEOUT N/A 01/17/2021   Procedure: DGLO ICD CHANGEOUT;  Surgeon: Deboraha Sprang, MD;  Location: [place] [facility];  Service: Cardiovascular;  Laterality: N/A;   TRANSTHORACIC ECHOCARDIOGRAM  08/01/2014    Normal wall motion. Normal diastolic function. Trivial MR   VENTRAL HERNIA REPAIR N/A  12/26/2014   Procedure: LAPAROSCOPIC REPAIR OF PERIUMBILICAL INCISIONAL HERNIA;  Surgeon: Michael Boston, MD;  Location: [place];  Service: General;  Laterality: N/A;    Current Outpatient Medications  Medication Sig Dispense Refill   Cholecalciferol (VITAMIN D3 PO) Take 1 tablet by mouth daily.     diltiazem (CARDIZEM CD) 120 MG 24 hr capsule TAKE 1 CAPSULE BY MOUTH EVERY DAY 90 capsule 3   furosemide (LASIX) 20 MG tablet Take 1 tablet (20 mg total) by mouth daily as needed. 90 tablet 3   isosorbide mononitrate (IMDUR) 30 MG 24 hr tablet Take 1 tablet (30 mg total) by mouth daily. 90 tablet 2   nitroGLYCERIN (NITROSTAT) 0.4 MG SL tablet PLACE 1 TABLET (0.4 MG TOTAL) UNDER THE TONGUE EVERY 5 (FIVE) MINUTES AS NEEDED FOR CHEST PAIN. 25 tablet 3   zolpidem (AMBIEN) 5 MG tablet TAKE 1 TABLET BY MOUTH AT BEDTIME AS NEEDED FOR SLEEP 30 tablet 5   No current facility-administered medications for this visit.    No Known Allergies   Physical Exam: BP 108/66   Pulse 69   Ht '5\' 10"'$  (1.778 m)   Wt 140 lb 6.4 oz (63.7 kg)   SpO2 98%   BMI 20.15 kg/m  Well developed and well nourished in no acute distress HENT normal Neck supple with JVP-flat Clear Device pocket well healed; without hematoma or erythema.  There is no tethering  Regular rate and rhythm, no murmur Abd-soft with active BS No Clubbing cyanosis there is asymmetric swelling of her left leg up to her mid thigh edema Skin-warm and dry A & Oriented  Grossly normal sensory and motor function  ECG sinus at 69 Interval 17/08/40 Low voltage    ECG:  Sinus 75 17/08/39 LAD -34 Low volts    Assessment and  Plan  Coronary spasm  Cardiac arrest associated with the above  ICD-subcutaneous  Myocardial infarction associated with the above  Asymmetric edema  Iron deficiency anemia   Device has healed well.  No chest pain.  We will continue her on diltiazem and isosorbide with her history of spasm She has iron deficiency  anemia.  We will begin her on iron supplementation.  I have tried to arrange for her to get guaiac cards as she has not had primary care follow-up in some years.  I have also strongly encouraged her to follow-up with her primary care office and seek out evaluation for her iron deficiency anemia.  I am also concerned with the unilateral swelling of her left leg and the absence of a venous clot.  I think she needs imaging of her pelvis.  I will reach out to colleagues to see whether she should see vascular and let them work this up.

## 2021-06-17 NOTE — Patient Instructions (Addendum)
Medication Instructions:  Your physician has recommended you make the following change in your medication:  Begin OTC iron supplement ('325mg'$ ) along with vitamin C to help with absorption.  *If you need a refill on your cardiac medications before your next appointment, please call your pharmacy*   Lab Work:  Follow anemia with PCP  If you have labs (blood work) drawn today and your tests are completely normal, you will receive your results only by: [place] (if you have MyChart) OR A paper copy in the mail If you have any lab test that is abnormal or we need to change your treatment, we will call you to review the results.   Testing/Procedures: None ordered.    Follow-Up: At [facility], you and your health needs are our priority.  As part of our continuing mission to provide you with exceptional heart care, we have created designated Provider Care Teams.  These Care Teams include your primary Cardiologist (physician) and Advanced Practice Providers (APPs -  Physician Assistants and Nurse Practitioners) who all work together to provide you with the care you need, when you need it.  We recommend signing up for the patient portal called "MyChart".  Sign up information is provided on this After Visit Summary.  MyChart is used to connect with patients for Virtual Visits (Telemedicine).  Patients are able to view lab/test results, encounter notes, upcoming appointments, etc.  Non-urgent messages can be sent to your provider as well.   To learn more about what you can do with MyChart, go to [email or web address].    Your next appointment:   9 months with Dr Caryl Comes  Important Information About Sugar

## 2021-06-18 ENCOUNTER — Encounter: Payer: Self-pay | Admitting: Internal Medicine

## 2021-06-18 DIAGNOSIS — R600 Localized edema: Secondary | ICD-10-CM

## 2021-06-20 ENCOUNTER — Telehealth: Payer: Self-pay | Admitting: Internal Medicine

## 2021-06-20 DIAGNOSIS — Z7689 Persons encountering health services in other specified circumstances: Secondary | ICD-10-CM

## 2021-06-20 DIAGNOSIS — D509 Iron deficiency anemia, unspecified: Secondary | ICD-10-CM

## 2021-06-20 NOTE — Telephone Encounter (Signed)
Patient calling back to follow-up.  She stated she was expecting a call back on Tuesday of this week.

## 2021-06-20 NOTE — Telephone Encounter (Signed)
Pt is calling regarding her office visit with Dr Caryl Comes earlier in the week.  She is asking specifically about who she needs to see for for left leg swelling.  Per Dr Olin Pia note:  I am also concerned with the unilateral swelling of her left leg and the absence of a venous clot.  I think she needs imaging of her pelvis.  I will reach out to colleagues to see whether she should see vascular and let them work this up. She also was instructed to f/u with her PCP regarding iron deficiency anemia.  She called and was advised they are unable to get her in until September.  She has not been seen at her PCP in 3 years and her previous MD has retired.  Advised I will place a referral for her to [facility] to est and for evaluation.    Pt is aware she will be contacted regarding next steps as ordered by Dr Caryl Comes.

## 2021-06-23 NOTE — Telephone Encounter (Signed)
I spoke with Gerrit Halls, he thought the best thing was to refer her to [facility], probably Dr. Donzetta Matters or one of the guys who thinks about veins   ( plorese let me know who the appt is with and will contact them directly  Thanks SK

## 2021-06-25 NOTE — Telephone Encounter (Signed)
She needs to be set up to see [facility] ASAP thanks

## 2021-06-25 NOTE — Telephone Encounter (Signed)
Referral was placed to [facility] on 06/24/2021.

## 2021-07-07 ENCOUNTER — Encounter: Payer: Self-pay | Admitting: Internal Medicine

## 2021-07-07 DIAGNOSIS — R600 Localized edema: Secondary | ICD-10-CM

## 2021-07-07 DIAGNOSIS — R609 Edema, unspecified: Secondary | ICD-10-CM

## 2021-07-09 ENCOUNTER — Ambulatory Visit (INDEPENDENT_AMBULATORY_CARE_PROVIDER_SITE_OTHER)
Admission: RE | Admit: 2021-07-09 | Discharge: 2021-07-09 | Disposition: A | Discharge status: Home / Self Care | Payer: BC Managed Care – PPO | Source: Ambulatory Visit | Attending: Internal Medicine | Admitting: Internal Medicine

## 2021-07-09 DIAGNOSIS — R600 Localized edema: Secondary | ICD-10-CM

## 2021-07-09 MED ORDER — IOHEXOL 300 MG/ML  SOLN
80.0000 mL | Freq: Once | INTRAMUSCULAR | Status: AC | PRN
  Administered 2021-07-09: 80 mL via INTRAVENOUS

## 2021-07-11 ENCOUNTER — Other Ambulatory Visit: Payer: Self-pay | Admitting: Internal Medicine

## 2021-07-14 ENCOUNTER — Other Ambulatory Visit: Payer: Self-pay

## 2021-07-14 ENCOUNTER — Encounter: Payer: Self-pay | Admitting: Surgery

## 2021-07-14 ENCOUNTER — Ambulatory Visit: Payer: BC Managed Care – PPO | Admitting: Surgery

## 2021-07-14 VITALS — BP 101/66 | HR 71 | Temp 98.1°F | Resp 20 | Ht 70.0 in | Wt 139.0 lb

## 2021-07-14 DIAGNOSIS — M7989 Other specified soft tissue disorders: Secondary | ICD-10-CM

## 2021-07-14 NOTE — Progress Notes (Signed)
Vascular and Vein Specialist of [place]  Patient name: Jessica Collier MRN: [id number] DOB: [birth year] Sex: female   REQUESTING PROVIDER:    Dr. Caryl Comes   REASON FOR CONSULT:    Left leg swelling  HISTORY OF PRESENT ILLNESS:   Jessica Collier is a 61 y.o. 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  She has undergone ICD implantation for prevention.  She was seen by Dr. Caryl Comes recently and she was complaining of left leg swelling since February 2023.  She has had venous Dopplers that were unremarkable.  Her BNP was normal.  She took Lasix that did not help.  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  Cardiac MRI confirms area of infarction in the territory of the right coronary artery.      FAMILY HISTORY   Family History  Problem Relation Age of Onset   Hypertension Father     SOCIAL HISTORY:   Social History   Socioeconomic History   Marital status: Married    Spouse name: Not on file   Number of children: Not on file   Years of education: Not on file   Highest education level: Not on file   Occupational History   Not on file  Tobacco Use   Smoking status: Never   Smokeless tobacco: Never  Vaping Use   Vaping Use: Every day  Substance and Sexual Activity   Alcohol use: No   Drug use: No   Sexual activity: Yes  Other Topics Concern   Not on file  Social History Narrative   Not on file   Social Determinants of Health   Financial Resource Strain: Not on file  Food Insecurity: Not on file  Transportation Needs: Not on file  Physical Activity: Not on file  Stress: Not on file  Social Connections: Not on file  Intimate Partner Violence: Not on file    ALLERGIES:    No Known Allergies  CURRENT MEDICATIONS:    Current Outpatient Medications  Medication Sig Dispense Refill   Cholecalciferol (VITAMIN D3 PO) Take 1 tablet by mouth daily.     diltiazem (CARDIZEM CD) 120 MG 24 hr capsule TAKE 1 CAPSULE BY MOUTH EVERY DAY 90 capsule 3   furosemide (LASIX) 20 MG tablet Take 1 tablet (20 mg total) by mouth daily as needed. 90 tablet 3   isosorbide mononitrate (IMDUR) 30 MG 24 hr tablet Take 1 tablet (30 mg total) by mouth daily. 90 tablet 2   nitroGLYCERIN (NITROSTAT) 0.4 MG SL tablet PLACE 1 TABLET (0.4 MG TOTAL) UNDER THE TONGUE EVERY 5 (FIVE) MINUTES AS NEEDED FOR CHEST PAIN. 25 tablet 3   zolpidem (AMBIEN) 5 MG tablet TAKE 1 TABLET BY MOUTH AT  BEDTIME AS NEEDED FOR SLEEP 30 tablet 5   No current facility-administered medications for this visit.    REVIEW OF SYSTEMS:   '[X]'$  denotes positive finding, '[ ]'$  denotes negative finding Cardiac  Comments:  Chest pain or chest pressure:    Shortness of breath upon exertion:    Short of breath when lying flat:    Irregular heart rhythm:        Vascular    Pain in calf, thigh, or hip brought on by ambulation:    Pain in feet at night that wakes you up from your sleep:     Blood clot in your veins:    Leg swelling:  x       Pulmonary    Oxygen at home:    Productive cough:     Wheezing:         Neurologic     Sudden weakness in arms or legs:     Sudden numbness in arms or legs:     Sudden onset of difficulty speaking or slurred speech:    Temporary loss of vision in one eye:     Problems with dizziness:         Gastrointestinal    Blood in stool:      Vomited blood:         Genitourinary    Burning when urinating:     Blood in urine:        Psychiatric    Major depression:         Hematologic    Bleeding problems:    Problems with blood clotting too easily:        Skin    Rashes or ulcers:        Constitutional    Fever or chills:     PHYSICAL EXAM:   Vitals:   07/14/21 1425  BP: 101/66  Pulse: 71  Resp: 20  Temp: 98.1 F (36.7 C)  SpO2: 96%  Weight: 139 lb (63 kg)  Height: '5\' 10"'$  (1.778 m)    GENERAL: The patient is a well-nourished female, in no acute distress. The vital signs are documented above. CARDIAC: There is a regular rate and rhythm.  VASCULAR: Palpable pedal pulses.  Left leg edema not extending onto the foot PULMONARY: Nonlabored respirations ABDOMEN: Soft and non-tender with normal pitched bowel sounds.  MUSCULOSKELETAL: There are no major deformities or cyanosis. NEUROLOGIC: No focal weakness or paresthesias are detected. SKIN: There are no ulcers or rashes noted. PSYCHIATRIC: The patient has a normal affect.  STUDIES:   I have reviewed the following: CT pelvis: No evidence of left iliac vein compression, however the aortic bifurcation was not fully visualized there was no pelvic mass.  Venous Doppler: RIGHT:  - No evidence of common femoral vein obstruction.     LEFT:  - No evidence of deep vein thrombosis in the lower extremity. No indirect  evidence of obstruction proximal to the inguinal ligament.  - No cystic structure found in the popliteal fossa.   ASSESSMENT and PLAN   Left leg swelling: Unfortunately the CT scan cut off the aortic bifurcation and so I cannot completely rule out May Thurner syndrome.  I discussed with the patient  and her husband that I would like to get a venous reflux evaluation to evaluate her for reflux.  If this is unremarkable, I think the neck step would be to perform left leg venography and IVUS evaluation of her left iliac veins.  If all  of this is unremarkable, then my suspicion is that this is lymphedema and we would refer her to lymphedema therapist and continue with compression therapy.  She is can return to see me in 1 week for her venous reflux evaluation.  She will get fitted for 20-30 thigh-high compression stockings today.   Leia Alf, MD, FACS [facility] of [facility] [phone] Pager [phone]

## 2021-07-21 ENCOUNTER — Ambulatory Visit: Payer: BC Managed Care – PPO | Admitting: Surgery

## 2021-07-21 ENCOUNTER — Ambulatory Visit (HOSPITAL_COMMUNITY)
Admission: RE | Admit: 2021-07-21 | Discharge: 2021-07-21 | Disposition: A | Discharge status: Home / Self Care | Payer: BC Managed Care – PPO | Source: Ambulatory Visit | Attending: Surgery | Admitting: Surgery

## 2021-07-21 ENCOUNTER — Encounter: Payer: Self-pay | Admitting: Surgery

## 2021-07-21 VITALS — BP 105/69 | HR 72 | Temp 98.0°F | Resp 20 | Ht 70.0 in | Wt 139.0 lb

## 2021-07-21 DIAGNOSIS — I872 Venous insufficiency (chronic) (peripheral): Secondary | ICD-10-CM | POA: Diagnosis not present

## 2021-07-21 DIAGNOSIS — M7989 Other specified soft tissue disorders: Secondary | ICD-10-CM | POA: Diagnosis present

## 2021-07-21 NOTE — Progress Notes (Signed)
Vascular and Vein Specialist of [place]  Patient name: Jessica Collier MRN: [id number] DOB: Sep 02, 1960 Sex: female   REASON FOR VISIT:    Follow up  HISOTRY OF PRESENT ILLNESS:   Jessica Collier is a 61 y.o. 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  She has undergone ICD implantation for prevention.  She was seen by Dr. Caryl Comes recently and she was complaining of left leg swelling since February 2023.  She has had venous Dopplers that were unremarkable.  Her BNP was normal.  She took Lasix that did not help.  She has worn compression socks but the fluid builds up around her knee.  She got a CT scan to look for May Thurner syndrome however the aortic bifurcation was not visualized on the scan.  I therefore sent her to get a formal reflux evaluation before proceeding with venogram.  She was fitted for thigh-high compression stocks as well.     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  Cardiac MRI confirms area of infarction in the territory of the right coronary artery.      FAMILY HISTORY:   Family History  Problem Relation Age of Onset   Hypertension Father     SOCIAL  HISTORY:   Social History   Tobacco Use   Smoking status: Never   Smokeless tobacco: Never  Substance Use Topics   Alcohol use: No     ALLERGIES:   No Known Allergies   CURRENT MEDICATIONS:   Current Outpatient Medications  Medication Sig Dispense Refill   Cholecalciferol (VITAMIN D3 PO) Take 1 tablet by mouth daily.     diltiazem (CARDIZEM CD) 120 MG 24 hr capsule TAKE 1 CAPSULE BY MOUTH EVERY DAY 90 capsule 3   furosemide (LASIX) 20 MG tablet Take 1 tablet (20 mg total) by mouth daily as needed. 90 tablet 3   isosorbide mononitrate (IMDUR) 30 MG 24 hr tablet Take 1 tablet (30 mg total) by mouth daily. 90 tablet 2   nitroGLYCERIN (NITROSTAT) 0.4 MG SL tablet PLACE 1 TABLET (0.4 MG TOTAL) UNDER THE TONGUE EVERY 5 (FIVE) MINUTES AS NEEDED FOR CHEST PAIN. 25 tablet 3   zolpidem (AMBIEN) 5 MG tablet TAKE 1 TABLET BY MOUTH AT BEDTIME AS NEEDED FOR SLEEP 30 tablet 5   No current facility-administered medications for this visit.    REVIEW OF SYSTEMS:   '[X]'$  denotes positive finding, '[ ]'$  denotes negative finding Cardiac  Comments:  Chest pain or chest pressure:    Shortness of breath upon exertion:    Short of breath when lying flat:    Irregular heart rhythm:        Vascular    Pain in calf, thigh, or hip brought on by ambulation:    Pain in feet at night that wakes  you up from your sleep:     Blood clot in your veins:    Leg swelling:  x       Pulmonary    Oxygen at home:    Productive cough:     Wheezing:         Neurologic    Sudden weakness in arms or legs:     Sudden numbness in arms or legs:     Sudden onset of difficulty speaking or slurred speech:    Temporary loss of vision in one eye:     Problems with dizziness:         Gastrointestinal    Blood in stool:     Vomited blood:         Genitourinary    Burning when urinating:     Blood in urine:        Psychiatric    Major depression:         Hematologic    Bleeding problems:    Problems with  blood clotting too easily:        Skin    Rashes or ulcers:        Constitutional    Fever or chills:      PHYSICAL EXAM:   Vitals:   07/21/21 1345  BP: 105/69  Pulse: 72  Resp: 20  Temp: 98 F (36.7 C)  SpO2: 97%  Weight: 139 lb (63 kg)  Height: '5\' 10"'$  (1.778 m)    GENERAL: The patient is a well-nourished female, in no acute distress. The vital signs are documented above. CARDIAC: There is a regular rate and rhythm.  VASCULAR: SonoSite was used to evaluate the left saphenous vein.  This appears to be straight without significant branching.  Vein diameters appear to be around 5 mm. PULMONARY: Non-labored respirations MUSCULOSKELETAL: There are no major deformities or cyanosis. NEUROLOGIC: No focal weakness or paresthesias are detected. SKIN: There are no ulcers or rashes noted. PSYCHIATRIC: The patient has a normal affect.  STUDIES:   I have reviewed the following: +--------------+---------+------+-----------+------------+--------+  LEFT          Reflux NoRefluxReflux TimeDiameter cmsComments                          Yes                                   +--------------+---------+------+-----------+------------+--------+  CFV                     yes   >1 second                       +--------------+---------+------+-----------+------------+--------+  FV mid        no                                              +--------------+---------+------+-----------+------------+--------+  Popliteal     no                                              +--------------+---------+------+-----------+------------+--------+  GSV at [facility]  yes    >500 ms      0.66              +--------------+---------+------+-----------+------------+--------+  GSV prox thigh          yes    >500 ms      0.42              +--------------+---------+------+-----------+------------+--------+  GSV mid thigh           yes    >500 ms      0.36               +--------------+---------+------+-----------+------------+--------+  GSV dist thigh          yes    >500 ms      0.44              +--------------+---------+------+-----------+------------+--------+  GSV at knee             yes    >500 ms      0.56              +--------------+---------+------+-----------+------------+--------+  GSV prox calf           yes    >500 ms      0.34              +--------------+---------+------+-----------+------------+--------+  GSV mid calf  no                            0.30              +--------------+---------+------+-----------+------------+--------+  SSV Pop Fossa no                            0.23              +--------------+---------+------+-----------+------------+--------+  SSV prox calf no                            0.16              +--------------+---------+------+-----------+------------+--------+  SSV mid calf  no                            0.25              +--------------+---------+------+-----------+------------+--------+  AASV o        no                            0.26              +--------------+---------+------+-----------+------------+--------+   MEDICAL ISSUES:   CEAP class 2: Patient's reflux study today shows significant saphenous vein reflux.  When I evaluated her vein it appears slightly larger than the findings on ultrasound.  I discussed with the patient that I would recommend proceeding with endovenous laser ablation of the left saphenous vein to help with her swelling.  I also discussed that she could have superimposed lymphedema.  I think laser ablation will definitely help her swelling but may not completely resolve it.  She has already talked to her lymphedema therapist friend and has specific exercises that she is going to perform.  In addition, she is going to wear 20-30 thigh-high compression stockings.  We will evaluate her in 3 months for  intervention.  Leia Alf, MD, FACS [facility] and Vein Specialists of [facility] [phone] Pager [phone]

## 2021-07-25 DIAGNOSIS — Z1231 Encounter for screening mammogram for malignant neoplasm of breast: Secondary | ICD-10-CM | POA: Insufficient documentation

## 2021-07-25 DIAGNOSIS — Z0000 Encounter for general adult medical examination without abnormal findings: Secondary | ICD-10-CM | POA: Insufficient documentation

## 2021-07-25 DIAGNOSIS — D509 Iron deficiency anemia, unspecified: Secondary | ICD-10-CM | POA: Insufficient documentation

## 2021-07-28 ENCOUNTER — Ambulatory Visit (INDEPENDENT_AMBULATORY_CARE_PROVIDER_SITE_OTHER): Payer: BC Managed Care – PPO

## 2021-07-28 DIAGNOSIS — I4901 Ventricular fibrillation: Secondary | ICD-10-CM | POA: Diagnosis not present

## 2021-08-01 LAB — CUP PACEART REMOTE DEVICE CHECK
Battery Remaining Percentage: 96 %
Date Time Interrogation Session: 20230721001600
Implantable Lead Implant Date: 20160722
Implantable Lead Location: 753862
Implantable Lead Model: 3401
Implantable Pulse Generator Implant Date: 20230109
Pulse Gen Serial Number: 170756

## 2021-09-01 NOTE — Progress Notes (Signed)
Remote ICD transmission.   

## 2021-10-27 ENCOUNTER — Ambulatory Visit (INDEPENDENT_AMBULATORY_CARE_PROVIDER_SITE_OTHER): Payer: BC Managed Care – PPO

## 2021-10-27 ENCOUNTER — Ambulatory Visit: Payer: BC Managed Care – PPO | Admitting: Surgery

## 2021-10-27 DIAGNOSIS — I469 Cardiac arrest, cause unspecified: Secondary | ICD-10-CM | POA: Diagnosis not present

## 2021-10-29 LAB — CUP PACEART REMOTE DEVICE CHECK
Battery Remaining Percentage: 93 %
Date Time Interrogation Session: 20231017225600
Implantable Lead Implant Date: 20160722
Implantable Lead Location: 753862
Implantable Lead Model: 3401
Implantable Pulse Generator Implant Date: 20230109
Pulse Gen Serial Number: 170756

## 2021-11-03 ENCOUNTER — Ambulatory Visit (INDEPENDENT_AMBULATORY_CARE_PROVIDER_SITE_OTHER): Payer: BC Managed Care – PPO | Admitting: Surgery

## 2021-11-03 ENCOUNTER — Encounter: Payer: Self-pay | Admitting: Surgery

## 2021-11-03 VITALS — BP 114/71 | HR 74 | Temp 98.0°F | Resp 20 | Ht 70.0 in | Wt 143.7 lb

## 2021-11-03 DIAGNOSIS — M7989 Other specified soft tissue disorders: Secondary | ICD-10-CM | POA: Diagnosis not present

## 2021-11-03 NOTE — Progress Notes (Signed)
Vascular and Vein Specialist of [place]  Patient name: Jessica Collier MRN: [id number] DOB: 06-10-1960 Sex: female   REASON FOR VISIT:    Follow-up  HISOTRY OF PRESENT ILLNESS:   Jessica Collier is a 61 y.o. 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  She has undergone ICD implantation for prevention.  She was seen by Dr. Caryl Comes recently and she was complaining of left leg swelling since February 2023.  She has had venous Dopplers that were unremarkable.  Her BNP was normal.  She took Lasix that did not help.  She has worn compression socks but the fluid builds up around her knee.  She got a CT scan to look for May Thurner syndrome however the aortic bifurcation was not visualized on the scan.  I therefore sent her to get a formal reflux evaluation which showed significant saphenous vein reflux.  We discussed the possibility of saphenous vein ablation she has already been working with the lymphedema therapist.  She was fitted for thigh-high compression stocks as well.   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  Cardiac MRI confirms area of infarction in the territory of the right  coronary artery.      FAMILY HISTORY:   Family History  Problem Relation Age of Onset   Hypertension Father     SOCIAL HISTORY:   Social History   Tobacco Use   Smoking status: Never   Smokeless tobacco: Never  Substance Use Topics   Alcohol use: No     ALLERGIES:   No Known Allergies   CURRENT MEDICATIONS:   Current Outpatient Medications  Medication Sig Dispense Refill   Cholecalciferol (VITAMIN D3 PO) Take 1 tablet by mouth daily.     diltiazem (CARDIZEM CD) 120 MG 24 hr capsule TAKE 1 CAPSULE BY MOUTH EVERY DAY 90 capsule 3   isosorbide mononitrate (IMDUR) 30 MG 24 hr tablet Take 1 tablet (30 mg total) by mouth daily. 90 tablet 2   nitroGLYCERIN (NITROSTAT) 0.4 MG SL tablet PLACE 1 TABLET (0.4 MG TOTAL) UNDER THE TONGUE EVERY 5 (FIVE) MINUTES AS NEEDED FOR CHEST PAIN. 25 tablet 3   zolpidem (AMBIEN) 5 MG tablet TAKE 1 TABLET BY MOUTH AT BEDTIME AS NEEDED FOR SLEEP 30 tablet 5   No current facility-administered medications for this visit.    REVIEW OF SYSTEMS:   '[X]'$  denotes positive finding, '[ ]'$  denotes negative finding Cardiac  Comments:  Chest pain or chest pressure:    Shortness of breath upon exertion:    Short of breath when lying flat:    Irregular heart rhythm:        Vascular    Pain in calf, thigh, or hip brought on by ambulation:    Pain in feet at night that wakes you up from your  sleep:     Blood clot in your veins:    Leg swelling:  x       Pulmonary    Oxygen at home:    Productive cough:     Wheezing:         Neurologic    Sudden weakness in arms or legs:     Sudden numbness in arms or legs:     Sudden onset of difficulty speaking or slurred speech:    Temporary loss of vision in one eye:     Problems with dizziness:         Gastrointestinal    Blood in stool:     Vomited blood:         Genitourinary    Burning when urinating:     Blood in urine:        Psychiatric    Major depression:         Hematologic    Bleeding  problems:    Problems with blood clotting too easily:        Skin    Rashes or ulcers:        Constitutional    Fever or chills:      PHYSICAL EXAM:   Vitals:   11/03/21 0838  BP: 114/71  Pulse: 74  Resp: 20  Temp: 98 F (36.7 C)  SpO2: 95%  Weight: 143 lb 11.2 oz (65.2 kg)  Height: '5\' 10"'$  (1.778 m)    GENERAL: The patient is a well-nourished female, in no acute distress. The vital signs are documented above. CARDIAC: There is a regular rate and rhythm.  VASCULAR: SonoSite was used to evaluate the left great saphenous vein which by my measurements is 5 mm.  It is straight. PULMONARY: Non-labored respirations MUSCULOSKELETAL: There are no major deformities or cyanosis. NEUROLOGIC: No focal weakness or paresthesias are detected. SKIN: There are no ulcers or rashes noted. PSYCHIATRIC: The patient has a normal affect.  STUDIES:   I reviewed her saphenous reflux study with the following findings:  +--------------+---------+------+-----------+------------+--------+  LEFT          Reflux NoRefluxReflux TimeDiameter cmsComments                          Yes                                   +--------------+---------+------+-----------+------------+--------+  CFV                     yes   >1 second                       +--------------+---------+------+-----------+------------+--------+  FV mid        no                                              +--------------+---------+------+-----------+------------+--------+  Popliteal     no                                              +--------------+---------+------+-----------+------------+--------+  GSV at [facility]  yes    >500 ms      0.66              +--------------+---------+------+-----------+------------+--------+  GSV prox thigh          yes    >500 ms      0.42              +--------------+---------+------+-----------+------------+--------+  GSV mid thigh            yes    >500 ms      0.36              +--------------+---------+------+-----------+------------+--------+  GSV dist thigh          yes    >500 ms      0.44              +--------------+---------+------+-----------+------------+--------+  GSV at knee             yes    >500 ms      0.56              +--------------+---------+------+-----------+------------+--------+  GSV prox calf           yes    >500 ms      0.34              +--------------+---------+------+-----------+------------+--------+  GSV mid calf  no                            0.30              +--------------+---------+------+-----------+------------+--------+  SSV Pop Fossa no                            0.23              +--------------+---------+------+-----------+------------+--------+  SSV prox calf no                            0.16              +--------------+---------+------+-----------+------------+--------+  SSV mid calf  no                            0.25              +--------------+---------+------+-----------+------------+--------+  AASV o        no                            0.26              +--------------+---------+------+-----------+------------+--------+   MEDICAL ISSUES:   CEAP class II disease, left leg: I discussed with her that I suspect the swelling she is having is the result of her left great saphenous vein reflux.  However, I cannot completely rule out May Thurner.  She did have a CT scan, however this did not show the aortic bifurcation.  From what I can see on the CT scan there is a little bit of iliac vein compression, however I suspect the majority of her symptoms are from her saphenous vein reflux.  In addition, I would like to treat her saphenous vein reflux before committing her to a stent, which would be how her may Vira Blanco would be treated.  I discussed the details of the procedure.  I will  plan on cannulating her saphenous vein at the  level of the knee.  The vein is straight throughout.    Leia Alf, MD, FACS [facility] of [facility] [phone] Pager [phone]

## 2021-11-04 ENCOUNTER — Other Ambulatory Visit: Payer: Self-pay | Admitting: *Deleted

## 2021-11-04 DIAGNOSIS — I83892 Varicose veins of left lower extremities with other complications: Secondary | ICD-10-CM

## 2021-11-13 NOTE — Progress Notes (Signed)
Remote ICD transmission.   

## 2021-11-17 ENCOUNTER — Other Ambulatory Visit: Payer: Self-pay | Admitting: Internal Medicine

## 2021-12-02 ENCOUNTER — Telehealth: Payer: Self-pay | Admitting: Internal Medicine

## 2021-12-02 MED ORDER — NITROGLYCERIN 0.4 MG SL SUBL: 0.4000 mg | SUBLINGUAL_TABLET | SUBLINGUAL | 3 refills | Status: DC | PRN

## 2021-12-02 NOTE — Telephone Encounter (Signed)
This is Dr. Harding's pt. °

## 2021-12-02 NOTE — Telephone Encounter (Signed)
*  STAT* If patient is at the pharmacy, call can be transferred to refill team.   1. Which medications need to be refilled? (please list name of each medication and dose if known)  nitroGLYCERIN (NITROSTAT) 0.4 MG SL tablet   2. Which pharmacy/location (including street and city if local pharmacy) is medication to be sent to? [id number] [place], [place] - [address]  3. Do they need a 30 day or 90 day supply?  30 day

## 2021-12-31 ENCOUNTER — Other Ambulatory Visit: Payer: Self-pay | Admitting: *Deleted

## 2021-12-31 MED ORDER — LORAZEPAM 1 MG PO TABS: ORAL_TABLET | ORAL | 0 refills | Status: DC

## 2022-01-07 ENCOUNTER — Telehealth: Payer: Self-pay | Admitting: Internal Medicine

## 2022-01-07 NOTE — Telephone Encounter (Signed)
Pam  thanks again for your help yesterday She can take ibuprofen

## 2022-01-07 NOTE — Telephone Encounter (Signed)
Pt is scheduled for a vein surgery tomorrow with Dr Chaya Jan.  Her instruction state for her to take 600 mg Ibuprofen TID for the next several weeks.  She has previously been instructed to not take any type of anti-inflammatory medications and would like to know if Dr Caryl Comes feels this is safe for her.  Advised I will forward to Dr Caryl Comes for his review but he is working in the hospital today doing procedures and I am not sure when he will review this.  Advised in the meanwhile to call back and verify with Dr Bethena Roys office. Pt states understanding and is awaiting a call back from Dr Bethena Roys' office.

## 2022-01-07 NOTE — Telephone Encounter (Signed)
Pt is aware per Dr Caryl Comes OK to take Ibuprofen.

## 2022-01-07 NOTE — Telephone Encounter (Signed)
Patient states she has a procedure scheduled for tomorrow, 12/28 8:00 AM, and she has been instructed to take Ibuprofen, per procedure instructions. However, patient states that in the past she has been advised not to take Ibuprofen. She would like to know if there is another medication that she may be eligible to take. Please advise.

## 2022-01-08 ENCOUNTER — Ambulatory Visit (INDEPENDENT_AMBULATORY_CARE_PROVIDER_SITE_OTHER): Payer: BC Managed Care – PPO | Admitting: Surgery

## 2022-01-08 ENCOUNTER — Encounter: Payer: Self-pay | Admitting: Surgery

## 2022-01-08 VITALS — BP 112/71 | HR 77 | Temp 97.6°F | Resp 16 | Ht 70.0 in | Wt 140.0 lb

## 2022-01-08 DIAGNOSIS — I83892 Varicose veins of left lower extremities with other complications: Secondary | ICD-10-CM

## 2022-01-08 HISTORY — PX: ENDOVENOUS ABLATION SAPHENOUS VEIN W/ LASER: SUR449

## 2022-01-08 NOTE — Progress Notes (Signed)
     Laser Ablation Procedure    Date: 01/08/2022   Jessica Collier DOB:02-05-1960  Consent signed: Yes      Surgeon: Harold Barban MD   Procedure: Laser Ablation: left Greater Saphenous Vein  BP 112/71 (BP Location: Right Arm, Patient Position: Sitting, Cuff Size: Normal)   Pulse 77   Temp 97.6 F (36.4 C) (Temporal)   Resp 16   Ht '5\' 10"'$  (1.778 m)   Wt 140 lb (63.5 kg)   SpO2 100%   BMI 20.09 kg/m   Tumescent Anesthesia: 350 cc 0.9% NaCl with 50 cc Lidocaine HCL 1%  and 15 cc 8.4% NaHCO3  Local Anesthesia: 3 cc Lidocaine HCL and NaHCO3 (ratio 2:1)  7 watts continuous mode     Total energy: 1855 Joules    Total time: 265 seconds  Treatment Length  37 cm   Laser Fiber Ref. #  [id number]    Lot # [id number]    Patient tolerated procedure well  Notes:   All staff members wore facial masks. Jessica Collier took Ativan 1 mg (1 tablet) on 01-08-2022 at 7:30 AM and at 8:10 AM.    Description of Procedure:  After marking the course of the secondary varicosities, the patient was placed on the operating table in the supine position, and the left leg was prepped and draped in sterile fashion.   Local anesthetic was administered and under ultrasound guidance the saphenous vein was accessed with a micro needle and guide wire; then the mirco puncture sheath was placed.  A guide wire was inserted saphenofemoral junction , followed by a 5 french sheath.  The position of the sheath and then the laser fiber below the junction was confirmed using the ultrasound.  Tumescent anesthesia was administered along the course of the saphenous vein using ultrasound guidance. The patient was placed in Trendelenburg position and protective laser glasses were placed on patient and staff, and the laser was fired at 7 watts continuous mode for a total of 1855 joules.      Steri strip was applied to the IV insertion site and ABD pads and thigh high compression stockings were applied.  Ace wrap bandages were  applied over the left thigh and at the top of the saphenopopliteal junction. Blood loss was less than 15 cc.  Discharge instructions reviewed with patient and hardcopy of discharge instructions given to patient to take home. The patient ambulated out of the operating room having tolerated the procedure well.

## 2022-01-15 ENCOUNTER — Ambulatory Visit (HOSPITAL_COMMUNITY)
Admission: RE | Admit: 2022-01-15 | Discharge: 2022-01-15 | Disposition: A | Discharge status: Home / Self Care | Payer: BC Managed Care – PPO | Source: Ambulatory Visit | Attending: Vascular Surgery | Admitting: Vascular Surgery

## 2022-01-15 ENCOUNTER — Ambulatory Visit (INDEPENDENT_AMBULATORY_CARE_PROVIDER_SITE_OTHER): Payer: BC Managed Care – PPO | Admitting: Surgery

## 2022-01-15 ENCOUNTER — Encounter: Payer: Self-pay | Admitting: Surgery

## 2022-01-15 VITALS — BP 117/56 | HR 81 | Temp 98.3°F | Resp 18 | Ht 70.0 in | Wt 144.9 lb

## 2022-01-15 DIAGNOSIS — I83892 Varicose veins of left lower extremities with other complications: Secondary | ICD-10-CM

## 2022-01-15 NOTE — Progress Notes (Signed)
[facility] [place]  Patient name: Jessica Collier MRN: [id number] DOB: [birth year] Sex: female   REASON FOR VISIT:    Follow up  HISOTRY OF PRESENT ILLNESS:    Jessica Collier is a 62 y.o. 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  She has undergone ICD implantation for prevention.  She was seen by Dr. Caryl Comes recently and she was complaining of left leg swelling since February 2023.  She has had venous Dopplers that were unremarkable.  Her BNP was normal.  She took Lasix that did not help.  She has worn compression socks but the fluid builds up around her knee.  She got a CT scan to look for May Thurner syndrome however the aortic bifurcation was not visualized on the scan.  I therefore sent her to get a formal reflux evaluation which showed significant saphenous vein reflux.  She has already been working with the lymphedema therapist.  She was fitted for thigh-high compression stocks as well.  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  Cardiac MRI confirms area of infarction in the  territory of the right coronary artery.      FAMILY HISTORY:   Family History  Problem Relation Age of Onset   Hypertension Father     SOCIAL HISTORY:   Social History   Tobacco Use   Smoking status: Never   Smokeless tobacco: Never  Substance Use Topics   Alcohol use: No     ALLERGIES:   No Known Allergies   CURRENT MEDICATIONS:   Current Outpatient Medications  Medication Sig Dispense Refill   Cholecalciferol (VITAMIN D3 PO) Take 1 tablet by mouth daily.     diltiazem (CARDIZEM CD) 120 MG 24 hr capsule TAKE 1 CAPSULE BY MOUTH EVERY DAY 90 capsule 3   isosorbide mononitrate (IMDUR) 30 MG 24 hr tablet TAKE 1 TABLET BY MOUTH EVERY DAY 90 tablet 2   nitroGLYCERIN (NITROSTAT) 0.4 MG SL tablet Place 1 tablet (0.4 mg total) under the tongue every 5 (five) minutes as needed for chest pain. 25 tablet 3   zolpidem (AMBIEN) 5 MG tablet TAKE 1 TABLET BY MOUTH AT BEDTIME AS NEEDED FOR SLEEP 30 tablet 5   LORazepam (ATIVAN) 1 MG tablet Take 1 tablet 30 minutes prior to leaving house on day of office surgery.  Bring second tablet with you to office on day of office surgery. (Patient not taking: Reported on 01/15/2022) 2 tablet 0   No current facility-administered medications for this visit.    REVIEW OF SYSTEMS:   '[X]'$  denotes positive finding, '[ ]'$  denotes negative finding Cardiac  Comments:  Chest pain or chest pressure:    Shortness of breath upon exertion:  Short of breath when lying flat:    Irregular heart rhythm:        Vascular    Pain in calf, thigh, or hip brought on by ambulation:    Pain in feet at night that wakes you up from your sleep:     Blood clot in your veins:    Leg swelling:         Pulmonary    Oxygen at home:    Productive cough:     Wheezing:         Neurologic    Sudden weakness in arms or legs:     Sudden numbness in arms or legs:     Sudden onset of difficulty speaking or slurred speech:    Temporary loss of vision in one eye:      Problems with dizziness:         Gastrointestinal    Blood in stool:     Vomited blood:         Genitourinary    Burning when urinating:     Blood in urine:        Psychiatric    Major depression:         Hematologic    Bleeding problems:    Problems with blood clotting too easily:        Skin    Rashes or ulcers:        Constitutional    Fever or chills:      PHYSICAL EXAM:   Vitals:   01/15/22 1024  BP: (!) 117/56  Pulse: 81  Resp: 18  Temp: 98.3 F (36.8 C)  TempSrc: Temporal  SpO2: 100%  Weight: 144 lb 14.4 oz (65.7 kg)  Height: '5\' 10"'$  (1.778 m)    GENERAL: The patient is a well-nourished female, in no acute distress. The vital signs are documented above. CARDIAC: There is a regular rate and rhythm.  VASCULAR: No significant left leg edema.  Mild ecchymosis along the ablation site PULMONARY: Non-labored respirations SKIN: There are no ulcers or rashes noted. PSYCHIATRIC: The patient has a normal affect.  STUDIES:   I have reviewed the following ultrasound study: Left:  - No evidence of deep vein thrombosis seen in the left lower extremity,  from the common femoral through the popliteal veins.  - Successful laser ablation of the great saphenous vein from the distal  thigh to 0.78cm from the saphenofemoral junction.   MEDICAL ISSUES:   Status post left leg saphenous vein ablation.  Patient is recovering nicely.  Ultrasound shows successful closure and no evidence of DVT.  I suspect this should alleviate all of her left leg swelling.  I did try to get a CT scan to rule out May Thurner however the vena cava bifurcation was excluded from the CT scan.  If she has persistent left leg issues, I will need to reevaluate her from May Thurner, however I suspect that given the amount of reflux in her saphenous vein that this should alleviate her symptoms.  She will call me if she has any issues.    Leia Alf, MD, FACS [facility]  [facility] [phone] Pager [phone]

## 2022-01-26 ENCOUNTER — Ambulatory Visit: Payer: BC Managed Care – PPO

## 2022-01-26 DIAGNOSIS — I469 Cardiac arrest, cause unspecified: Secondary | ICD-10-CM

## 2022-01-26 DIAGNOSIS — I4901 Ventricular fibrillation: Secondary | ICD-10-CM

## 2022-01-27 LAB — CUP PACEART REMOTE DEVICE CHECK
Battery Remaining Percentage: 89 %
Date Time Interrogation Session: 20240115151400
Implantable Lead Connection Status: 753985
Implantable Lead Implant Date: 20160722
Implantable Lead Location: 753862
Implantable Lead Model: 3401
Implantable Pulse Generator Implant Date: 20230109
Pulse Gen Serial Number: 170756

## 2022-02-05 DIAGNOSIS — I469 Cardiac arrest, cause unspecified: Secondary | ICD-10-CM

## 2022-03-09 NOTE — Progress Notes (Signed)
Remote ICD transmission.   

## 2022-05-22 ENCOUNTER — Encounter: Payer: Self-pay | Admitting: Internal Medicine

## 2022-05-22 ENCOUNTER — Ambulatory Visit: Payer: BC Managed Care – PPO | Attending: Internal Medicine | Admitting: Internal Medicine

## 2022-05-22 VITALS — BP 96/70 | HR 70 | Ht 70.0 in | Wt 145.6 lb

## 2022-05-22 DIAGNOSIS — Z95810 Presence of automatic (implantable) cardiac defibrillator: Secondary | ICD-10-CM

## 2022-05-22 DIAGNOSIS — I469 Cardiac arrest, cause unspecified: Secondary | ICD-10-CM

## 2022-05-22 NOTE — Progress Notes (Signed)
Kathlene November, if you have a minute can you come by pod B tomorrow morning i.e. Wednesday I have a quick question for you thanks Patient ID: Jessica Collier, female   DOB: 1960-02-25, 62 y.o.   MRN: [id number]      Patient Care Team: Jaynee Eagles as PCP - General (Physician Assistant) Duke Salvia, MD as PCP - Electrophysiology (Cardiology) Karie Soda, MD as Consulting Physician (General Surgery) Marykay Lex, MD as Consulting Physician (Cardiology) Duke Salvia, MD as Consulting Physician (Cardiology)   HPI  Jessica Collier is a 62 y.o. female Seen in follow-up for cardiac arrest related to inferior wall spasm. MRI scanning had demonstrated scarring suggesting prior episodes of spasm as well. She underwent ICD implantation for secondary prevention--subcutaneous; generator replacement 1/23  No intercurrent shock but has a remote history of inappropriate shocks associated with T wave oversensing 2017; sensing vector reprogrammed   The patient denies chest pain, shortness of breath, nocturnal dyspnea, orthopnea    There have been no palpitations, lightheadedness or syncope.   Swelling of her left leg dated March 2023, venous Dopplers was unrevealing; underwent venous procedures.  No significant improvement.  No chest pain or shortness of breath.  Now retired DATE TEST EF   7/16 Echo   55 %   7/16 cMRI   55 % Full thickness Gad enhancement and RV apex  7/20 Echo  50-55%   5/23 Echo  50-55%           Date   Cr              K Hgb  12/16 0.74 5.0 13.2  6/20  0.73 3.8   13.6  5/23 0.58 3.9 11.7  Ferritin 8 (1/23)     Past Medical History:  Diagnosis Date   AICD (automatic cardioverter/defibrillator) present 08/04/2014   [facility] scientific device implanted subcutaneously because of ventricular fibrillation occurring during the setting of coronary spasm    Cardiac arrest ([facility]) 07/31/2014   Related to spasm induced inferior STEMI   Coronary artery spasm ([facility])  08/01/2014   Likely recurrent RCA spasm leading to transmural infarction of the distal RCA-PDA territory involving the apex and apical RV.   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  Cardiac MRI confirms area of infarction in the territory of the right coronary artery.     Past Surgical History:  Procedure Laterality Date   CARDIAC CATHETERIZATION N/A 07/31/2014   Procedure: Left Heart Cath and Coronary Angiography;  Surgeon: Lennette Bihari, MD;  Location: [facility] INVASIVE CV LAB;  Service: Cardiovascular;  normal coronary arteries. Normal EF   Cardiac MRI  08/01/2014   EF 58%. Apical inferior and septal akinesis and mild hypokinesis of basal inferior and inferolateral walls; late gadolinium enhancement of mid inferior, apical inferior and septal walls involving true apex and RV apex consistent with transmural infarction in RCA/PDA territory with RV involvement. RVEF 41% = mildly decreased with dyskinesis of the apical portion.   CESAREAN SECTION     X 2   CHOLECYSTECTOMY     COLONOSCOPY     ENDOMETRIAL ABLATION     ENDOVENOUS ABLATION SAPHENOUS VEIN W/ LASER Left 01/08/2022   endovenous laser ablation left greater saphenous vein by Coral Else MD   EP IMPLANTABLE DEVICE  N/A 08/03/2014   Procedure: SubQ ICD Implant;  Surgeon: Duke Salvia, MD;  Location: [facility] INVASIVE CV LAB;  Service: Cardiovascular;  Laterality: N/A;   INSERTION OF MESH N/A 12/26/2014   Procedure: INSERTION OF MESH;  Surgeon: Karie Soda, MD;  Location: [facility] OR;  Service: General;  Laterality: N/A;   SUBQ ICD CHANGEOUT N/A 01/17/2021   Procedure: WUJW ICD CHANGEOUT;  Surgeon: Duke Salvia, MD;  Location: [facility] INVASIVE CV LAB;  Service: Cardiovascular;  Laterality: N/A;   TRANSTHORACIC  ECHOCARDIOGRAM  08/01/2014   Normal wall motion. Normal diastolic function. Trivial MR   VENTRAL HERNIA REPAIR N/A 12/26/2014   Procedure: LAPAROSCOPIC REPAIR OF PERIUMBILICAL INCISIONAL HERNIA;  Surgeon: Karie Soda, MD;  Location: [facility] OR;  Service: General;  Laterality: N/A;    Current Outpatient Medications  Medication Sig Dispense Refill   Cholecalciferol (VITAMIN D3 PO) Take 1 tablet by mouth daily.     diltiazem (CARDIZEM CD) 120 MG 24 hr capsule TAKE 1 CAPSULE BY MOUTH EVERY DAY 90 capsule 3   isosorbide mononitrate (IMDUR) 30 MG 24 hr tablet TAKE 1 TABLET BY MOUTH EVERY DAY 90 tablet 2   nitroGLYCERIN (NITROSTAT) 0.4 MG SL tablet Place 1 tablet (0.4 mg total) under the tongue every 5 (five) minutes as needed for chest pain. 25 tablet 3   zolpidem (AMBIEN) 5 MG tablet TAKE 1 TABLET BY MOUTH AT BEDTIME AS NEEDED FOR SLEEP 30 tablet 5   No current facility-administered medications for this visit.    No Known Allergies   Physical Exam: BP 96/70   Pulse 70   Ht 5\' 10"  (1.778 m)   Wt 145 lb 9.6 oz (66 kg)   SpO2 99%   BMI 20.89 kg/m  Well developed and well nourished in no acute distress HENT normal Neck supple with JVP-flat Clear Device pocket well healed; without hematoma or erythema.  There is no tethering  Regular rate and rhythm, no  gallop No murmur Abd-soft with active BS No Clubbing cyanosis mild left leg edema edema Skin-warm and dry A & Oriented  Grossly normal sensory and motor function  ECG sinus @ 70 18/08/41  Device function is normal. Programming changes none  See Paceart for details      ECG:  Sinus 75 17/08/39 LAD -34 Low volts    Assessment and  Plan  Coronary spasm  Cardiac arrest associated with the above  ICD-subcutaneous  Myocardial infarction associated with the above  Asymmetric edema  Iron deficiency anemia   No interval arrhythmia  No chest pain continue the Imdur and the diltiazem; avoid aspirin.

## 2022-05-22 NOTE — Patient Instructions (Signed)
Medication Instructions:  Your physician recommends that you continue on your current medications as directed. Please refer to the Current Medication list given to you today.  *If you need a refill on your cardiac medications before your next appointment, please call your pharmacy*   Lab Work: None ordered.  If you have labs (blood work) drawn today and your tests are completely normal, you will receive your results only by: MyChart Message (if you have MyChart) OR A paper copy in the mail If you have any lab test that is abnormal or we need to change your treatment, we will call you to review the results.   Testing/Procedures: None ordered.    Follow-Up: At [facility], you and your health needs are our priority.  As part of our continuing mission to provide you with exceptional heart care, we have created designated Provider Care Teams.  These Care Teams include your primary Cardiologist (physician) and Advanced Practice Providers (APPs -  Physician Assistants and Nurse Practitioners) who all work together to provide you with the care you need, when you need it.  We recommend signing up for the patient portal called "MyChart".  Sign up information is provided on this After Visit Summary.  MyChart is used to connect with patients for Virtual Visits (Telemedicine).  Patients are able to view lab/test results, encounter notes, upcoming appointments, etc.  Non-urgent messages can be sent to your provider as well.   To learn more about what you can do with MyChart, go to [email or web address].    Your next appointment:   12 months with Dr Klein 

## 2022-05-25 NOTE — Addendum Note (Signed)
Addended by: Mariam Dollar on: 05/25/2022 11:20 AM   Modules accepted: Orders

## 2022-05-28 ENCOUNTER — Telehealth: Payer: Self-pay | Admitting: Internal Medicine

## 2022-05-28 NOTE — Telephone Encounter (Signed)
Dr. Graciela Husbands sent a message asking if I could see this patient or we could see her sooner than August which is what Deboraha Sprang has.  Her [facility] gastroenterologist is retired.  Please offer her May 21 banding slot that is open.  Reasons are abdominal pain and iron deficiency anemia.  If she has a copy of her last colonoscopy with Dr. Evette Cristal have her bring it.  If she can get the records that would be great also.

## 2022-05-28 NOTE — Telephone Encounter (Signed)
I have left her a detailed message to call me back first thing tomorrow to let me know if she can take this appointment date/time. I also said we need her records.

## 2022-05-29 NOTE — Telephone Encounter (Signed)
Patient is calling again to return PJ's phone call.

## 2022-05-29 NOTE — Telephone Encounter (Signed)
Patient called to return phone call. Requesting a call back. Please advise, thank you.  

## 2022-05-29 NOTE — Telephone Encounter (Signed)
Jessica Collier can make the May 21st appointment and she will try to get her records. She is aware of where we are.

## 2022-06-02 ENCOUNTER — Encounter: Payer: Self-pay | Admitting: Internal Medicine

## 2022-06-02 ENCOUNTER — Ambulatory Visit: Payer: BC Managed Care – PPO | Admitting: Internal Medicine

## 2022-06-02 ENCOUNTER — Other Ambulatory Visit (INDEPENDENT_AMBULATORY_CARE_PROVIDER_SITE_OTHER): Payer: BC Managed Care – PPO

## 2022-06-02 VITALS — BP 114/68 | HR 88 | Ht 70.0 in | Wt 147.0 lb

## 2022-06-02 DIAGNOSIS — R1013 Epigastric pain: Secondary | ICD-10-CM | POA: Diagnosis not present

## 2022-06-02 DIAGNOSIS — Z95810 Presence of automatic (implantable) cardiac defibrillator: Secondary | ICD-10-CM

## 2022-06-02 DIAGNOSIS — R932 Abnormal findings on diagnostic imaging of liver and biliary tract: Secondary | ICD-10-CM | POA: Diagnosis not present

## 2022-06-02 DIAGNOSIS — D509 Iron deficiency anemia, unspecified: Secondary | ICD-10-CM

## 2022-06-02 DIAGNOSIS — K769 Liver disease, unspecified: Secondary | ICD-10-CM

## 2022-06-02 DIAGNOSIS — Z52008 Unspecified donor, other blood: Secondary | ICD-10-CM

## 2022-06-02 LAB — CBC WITH DIFFERENTIAL/PLATELET
Basophils Absolute: 0 10*3/uL (ref 0.0–0.1)
Basophils Relative: 0.1 % (ref 0.0–3.0)
Eosinophils Absolute: 0.1 10*3/uL (ref 0.0–0.7)
Eosinophils Relative: 2.2 % (ref 0.0–5.0)
HCT: 41.2 % (ref 36.0–46.0)
Hemoglobin: 13.7 g/dL (ref 12.0–15.0)
Lymphocytes Relative: 13.5 % (ref 12.0–46.0)
Lymphs Abs: 0.8 10*3/uL (ref 0.7–4.0)
MCHC: 33.3 g/dL (ref 30.0–36.0)
MCV: 86.3 fl (ref 78.0–100.0)
Monocytes Absolute: 0.5 10*3/uL (ref 0.1–1.0)
Monocytes Relative: 9.1 % (ref 3.0–12.0)
Neutro Abs: 4.3 10*3/uL (ref 1.4–7.7)
Neutrophils Relative %: 75.1 % (ref 43.0–77.0)
Platelets: 212 10*3/uL (ref 150.0–400.0)
RBC: 4.77 Mil/uL (ref 3.87–5.11)
RDW: 13 % (ref 11.5–15.5)
WBC: 5.7 10*3/uL (ref 4.0–10.5)

## 2022-06-02 LAB — COMPREHENSIVE METABOLIC PANEL
ALT: 18 U/L (ref 0–35)
AST: 21 U/L (ref 0–37)
Albumin: 4.2 g/dL (ref 3.5–5.2)
Alkaline Phosphatase: 56 U/L (ref 39–117)
BUN: 25 mg/dL — ABNORMAL HIGH (ref 6–23)
CO2: 28 mEq/L (ref 19–32)
Calcium: 9.3 mg/dL (ref 8.4–10.5)
Chloride: 104 mEq/L (ref 96–112)
Creatinine, Ser: 0.74 mg/dL (ref 0.40–1.20)
GFR: 88.22 mL/min (ref >60.00)
Glucose, Bld: 100 mg/dL — ABNORMAL HIGH (ref 70–99)
Potassium: 4.1 mEq/L (ref 3.5–5.1)
Sodium: 139 mEq/L (ref 135–145)
Total Bilirubin: 0.3 mg/dL (ref 0.2–1.2)
Total Protein: 6.6 g/dL (ref 6.0–8.3)

## 2022-06-02 LAB — LIPASE: Lipase: 63 U/L — ABNORMAL HIGH (ref 11.0–59.0)

## 2022-06-02 LAB — AMYLASE: Amylase: 45 U/L (ref 27–131)

## 2022-06-02 LAB — FERRITIN: Ferritin: 26.9 ng/mL (ref 10.0–291.0)

## 2022-06-02 MED ORDER — NA SULFATE-K SULFATE-MG SULF 17.5-3.13-1.6 GM/177ML PO SOLN: 1.0000 | Freq: Once | ORAL | 0 refills | Status: AC

## 2022-06-02 NOTE — Progress Notes (Signed)
AMONEE DORWART 61 y.o. 02-Jul-1960 [id number]  Assessment & Plan:   Encounter Diagnoses  Name Primary?   Abdominal pain, epigastric Yes   Iron deficiency anemia, unspecified iron deficiency anemia type    Liver lesions    Abnormal CT of liver    AICD (automatic cardioverter/defibrillator) present    Blood donor    Evaluate abdominal pain and iron deficiency with EGD and colonoscopy.  I do think it is most likely the iron deficiency anemia is from blood donation.  She is advised to stop donating blood.  She has slowed down over the years but she should not donate again until given okay by medical provider if at all.  Suspect small liver lesion seen on CT scan in the past are benign but she has this upper abdominal pain and MRI would be more definitive.  She now has an AICD that is MRI compatible.  We will look into accomplishing this.     She has a normal ejection fraction so is an acceptable candidate for  endoscopy center and anesthesia there.   Orders Placed This Encounter  Procedures   CBC with Differential/Platelet   Comp Met (CMET)   Lipase   Amylase   Ferritin   Ambulatory referral to Gastroenterology    The risks and benefits as well as alternatives of endoscopic procedure(s) have been discussed and reviewed. All questions answered. The patient agrees to proceed. Depending upon results of the studies, consider treatment with a neuromodulator versus retrying a PPI.   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  Previously followed by Dr. Evette Cristal who has retired.  Also has iron deficiency anemia, in the setting of blood donation.  She describes epigastric pain, she had a cholecystectomy because of gallstones a number of years ago (2010) and thinks she has symptom-free.,  But then has  had chronic recurrent issues again for a number of years.  She is sensitive to touch there and she gets a moderate pressure in the epigastrium and radiating to the right upper quadrant and sometimes to the back after eating.  It can awaken her at night.  There is nausea at times but no vomiting.  The symptoms can last for hours.  She has been on PPI without clear benefit.  There is no evidence or sign of GI bleeding with melena or vomiting of blood etc.  She has had ultrasound imaging of the liver in 2020 and 2021, there was a suspicion of a hemangioma follow-up ultrasound was done and then there were multiple liver lesions seen.  These were new compared to previous CT scanning and ultrasound so she went on to have a CT scan but no MRI because of AICD.  Review of office records from 20 10-20 20 shows that she would have intermittent epigastric pain and heartburn symptoms.  She used PPI in the form of omeprazole which may have helped but also may have caused hives at 1 point.  Pantoprazole helped with right-sided chest pain at times.  Also in the upper quadrant sensation.  She has not had abnormal LFTs.  Patient  CT abdomen pelvis with and without contrast 05/27/2019 IMPRESSION: 1. No acute findings within the abdomen. 2. Multiple low-density liver lesions identified. The largest of these is in segment 6 measuring 1.5 cm and measures fluid density compatible  with a simple cyst. The other low-density liver lesions measure less than 1 cm and are technically too small to reliably characterize but are unchanged from 06/14/2018 and favored to represent small liver cysts or biliary hamartomas. No suspicious enhancing liver lesion identified at this time.  Summary of prior GI procedures:  Colonoscopy 06/28/2013 Dr. Dulce Sellar for screening, normal.  EGD for epigastric pain 11/23/2018, erythematous antral mucosa and scattered body polyps, pathology was chronic inactive gastritis and no H. pylori and fundic  gland polyps. No Known Allergies Current Meds  Medication Sig   Cholecalciferol (VITAMIN D3 PO) Take 1 tablet by mouth daily.   diltiazem (CARDIZEM CD) 120 MG 24 hr capsule TAKE 1 CAPSULE BY MOUTH EVERY DAY   isosorbide mononitrate (IMDUR) 30 MG 24 hr tablet TAKE 1 TABLET BY MOUTH EVERY DAY       nitroGLYCERIN (NITROSTAT) 0.4 MG SL tablet Place 1 tablet (0.4 mg total) under the tongue every 5 (five) minutes as needed for chest pain.   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  Cardiac MRI confirms area of infarction in the territory of the right coronary artery.    Past Surgical History:  Procedure Laterality Date   CARDIAC CATHETERIZATION N/A 07/31/2014   Procedure: Left Heart Cath and Coronary Angiography;  Surgeon: Lennette Bihari, MD;  Location: [facility] INVASIVE CV LAB;  Service: Cardiovascular;  normal coronary arteries. Normal EF   Cardiac MRI  08/01/2014   EF 58%. Apical inferior and septal akinesis and mild hypokinesis of basal inferior and inferolateral walls; late gadolinium enhancement of mid inferior, apical inferior and septal walls involving true apex and RV apex consistent with  transmural infarction in RCA/PDA territory with RV involvement. RVEF 41% = mildly decreased with dyskinesis of the apical portion.   CESAREAN SECTION     X 2   CHOLECYSTECTOMY     COLONOSCOPY     ENDOMETRIAL ABLATION     ENDOVENOUS ABLATION SAPHENOUS VEIN W/ LASER Left 01/08/2022   endovenous laser ablation left greater saphenous vein by Coral Else MD   EP IMPLANTABLE DEVICE N/A 08/03/2014   Procedure: SubQ ICD Implant;  Surgeon: Duke Salvia, MD;  Location: [facility] INVASIVE CV LAB;  Service: Cardiovascular;  Laterality: N/A;   INSERTION OF MESH N/A 12/26/2014   Procedure: INSERTION OF MESH;  Surgeon: Karie Soda, MD;  Location: [facility] OR;  Service: General;  Laterality: N/A;   SUBQ ICD CHANGEOUT N/A 01/17/2021   Procedure: ZOXW ICD CHANGEOUT;  Surgeon: Duke Salvia, MD;  Location: [facility] INVASIVE CV LAB;  Service: Cardiovascular;  Laterality: N/A;   TRANSTHORACIC ECHOCARDIOGRAM  08/01/2014   Normal wall motion. Normal diastolic function. Trivial MR   VENTRAL HERNIA REPAIR N/A 12/26/2014   Procedure: LAPAROSCOPIC REPAIR OF PERIUMBILICAL  INCISIONAL HERNIA;  Surgeon: Karie Soda, MD;  Location: [facility] OR;  Service: General;  Laterality: N/A;   Social History   Social History Narrative   Married 2 sons 1 daughter   Retired Engineer, structural   Never smoker no tobacco   No alcohol   No caffeine   No drug use   family history includes Hypertension in her father; Stomach cancer in her maternal aunt and maternal uncle.   Review of Systems  As per HPI otherwise negative Objective:   Physical Exam @BP  114/68   Pulse 88   Ht 5\' 10"  (1.778 m)   Wt 147 lb (66.7 kg)   BMI 21.09 kg/m @  General:  Well-developed, well-nourished and in no acute distress Eyes:  anicteric..  Lungs: Clear to auscultation bilaterally. AICD left lateral chest wall - midway Heart:   S1S2, no rubs, murmurs, gallops. Abdomen:  soft, non-tender, no hepatosplenomegaly, hernia, or mass and BS+.   Rectal: deferred  Neuro:  A&O x 3.  Psych:  appropriate mood and  Affect.   Data Reviewed: See HPI

## 2022-06-02 NOTE — Patient Instructions (Addendum)
Your provider has requested that you go to the basement level for lab work before leaving today. Press "B" on the elevator. The lab is located at the first door on the left as you exit the elevator.  Stop donating blood.   We will contact you regarding your MRI.   You have been scheduled for an endoscopy and colonoscopy. Please follow the written instructions given to you at your visit today. Please pick up your prep supplies at the pharmacy within the next 1-3 days. If you use inhalers (even only as needed), please bring them with you on the day of your procedure.  The [facility] providers would like to encourage you to use [facility] to communicate with providers for non-urgent requests or questions.  Due to long hold times on the telephone, sending your provider a message by [facility] may be a faster and more efficient way to get a response.  Please allow 48 business hours for a response.  Please remember that this is for non-urgent requests.    Due to recent changes in healthcare laws, you may see the results of your imaging and laboratory studies on MyChart before your provider has had a chance to review them.  We understand that in some cases there may be results that are confusing or concerning to you. Not all laboratory results come back in the same time frame and the provider may be waiting for multiple results in order to interpret others.  Please give [place] 48 hours in order for your provider to thoroughly review all the results before contacting the office for clarification of your results.    I appreciate the opportunity to care for you. Stan Head, MD, [facility]

## 2022-06-03 ENCOUNTER — Telehealth: Payer: Self-pay

## 2022-06-03 DIAGNOSIS — K769 Liver disease, unspecified: Secondary | ICD-10-CM

## 2022-06-03 DIAGNOSIS — R932 Abnormal findings on diagnostic imaging of liver and biliary tract: Secondary | ICD-10-CM

## 2022-06-03 NOTE — Telephone Encounter (Signed)
Patient has an AICD MRI compatible defibrillator. According to central scheduling they can do patients with an AICD at [facility]. They are booked out into July. I will forward to Dr Leone Payor to advise.

## 2022-06-04 NOTE — Telephone Encounter (Signed)
Dorothe informed that her MRI has been set up for 08/14/2022 at [facility], arrive at 12:00pm for a 1:00pm start, NPO 4 hours. Use Entrance A. She was given the # [phone] to call if she has to R/S.

## 2022-06-04 NOTE — Telephone Encounter (Signed)
Please schedule for MR liver with and without contrast at [facility] and inform that she has an AICD  Encounter Diagnoses  Name Primary?   Abnormal CT of liver Yes   Liver lesions

## 2022-06-10 ENCOUNTER — Encounter: Payer: Self-pay | Admitting: Internal Medicine

## 2022-06-23 ENCOUNTER — Other Ambulatory Visit: Payer: Self-pay | Admitting: Internal Medicine

## 2022-06-30 ENCOUNTER — Encounter: Payer: Self-pay | Admitting: Internal Medicine

## 2022-06-30 ENCOUNTER — Ambulatory Visit (AMBULATORY_SURGERY_CENTER): Payer: BC Managed Care – PPO | Admitting: Internal Medicine

## 2022-06-30 VITALS — BP 103/57 | HR 76 | Temp 97.7°F | Resp 18 | Ht 70.0 in | Wt 147.0 lb

## 2022-06-30 DIAGNOSIS — K317 Polyp of stomach and duodenum: Secondary | ICD-10-CM | POA: Diagnosis not present

## 2022-06-30 DIAGNOSIS — D509 Iron deficiency anemia, unspecified: Secondary | ICD-10-CM | POA: Diagnosis present

## 2022-06-30 DIAGNOSIS — R932 Abnormal findings on diagnostic imaging of liver and biliary tract: Secondary | ICD-10-CM

## 2022-06-30 DIAGNOSIS — K2970 Gastritis, unspecified, without bleeding: Secondary | ICD-10-CM

## 2022-06-30 DIAGNOSIS — R1013 Epigastric pain: Secondary | ICD-10-CM

## 2022-06-30 DIAGNOSIS — K2951 Unspecified chronic gastritis with bleeding: Secondary | ICD-10-CM | POA: Diagnosis not present

## 2022-06-30 DIAGNOSIS — K319 Disease of stomach and duodenum, unspecified: Secondary | ICD-10-CM | POA: Diagnosis not present

## 2022-06-30 HISTORY — PX: COLONOSCOPY WITH ESOPHAGOGASTRODUODENOSCOPY (EGD): SHX5779

## 2022-06-30 MED ORDER — SODIUM CHLORIDE 0.9 % IV SOLN
500.0000 mL | Freq: Once | INTRAVENOUS | Status: AC
Start: 2022-06-30 — End: ?

## 2022-06-30 NOTE — Op Note (Signed)
[facility] Patient Name: Keyasha Slimmer Procedure Date: 06/30/2022 2:48 PM MRN: [id number] Endoscopist: Iva Boop , MD, [id number] Age: 62 Referring MD:  Date of Birth: 1960/07/23 Gender: Female Account #: [id number] Procedure:                Upper GI endoscopy Indications:              Epigastric abdominal pain, Iron deficiency anemia Medicines:                Monitored Anesthesia Care Procedure:                Pre-Anesthesia Assessment:                           - Prior to the procedure, a History and Physical                            was performed, and patient medications and                            allergies were reviewed. The patient's tolerance of                            previous anesthesia was also reviewed. The risks                            and benefits of the procedure and the sedation                            options and risks were discussed with the patient.                            All questions were answered, and informed consent                            was obtained. Prior Anticoagulants: The patient has                            taken no anticoagulant or antiplatelet agents. ASA                            Grade Assessment: III - A patient with severe                            systemic disease. After reviewing the risks and                            benefits, the patient was deemed in satisfactory                            condition to undergo the procedure.                           After obtaining informed consent, the endoscope was  passed under direct vision. Throughout the                            procedure, the patient's blood pressure, pulse, and                            oxygen saturations were monitored continuously. The                            GIF [id number] #[id number] was introduced through the                            mouth, and advanced to the second part of duodenum.                             The upper GI endoscopy was accomplished without                            difficulty. The patient tolerated the procedure                            well. Scope In: Scope Out: Findings:                 Diffuse mild inflammation characterized by                            congestion (edema) and erythema was found in the                            gastric body and in the gastric antrum. Biopsies                            were taken with a cold forceps for histology.                            Verification of patient identification for the                            specimen was done. Estimated blood loss was minimal.                           Multiple diminutive sessile polyps with no stigmata                            of recent bleeding were found in the gastric fundus                            and in the gastric body. Biopsies were taken with a                            cold forceps for histology. Verification of patient  identification for the specimen was done. Estimated                            blood loss was minimal.                           A deformity was found in the prepyloric region of                            the stomach.                           The exam was otherwise without abnormality.                           The cardia and gastric fundus were normal on                            retroflexion. Complications:            No immediate complications. Estimated Blood Loss:     Estimated blood loss was minimal. Impression:               - Gastritis. Biopsied.                           - Multiple gastric polyps. Biopsied.                           - Deformity in the prepyloric region of the stomach.                           - The examination was otherwise normal. Recommendation:           - Patient has a contact number available for                            emergencies. The signs and symptoms of potential                             delayed complications were discussed with the                            patient. Return to normal activities tomorrow.                            Written discharge instructions were provided to the                            patient.                           - Resume previous diet.                           - Continue present medications.                           -  Await pathology results.                           - See the other procedure note for documentation of                            additional recommendations. Iva Boop, MD 06/30/2022 3:23:00 PM This report has been signed electronically.

## 2022-06-30 NOTE — Op Note (Signed)
[facility] Patient Name: Jessica Collier Procedure Date: 06/30/2022 2:47 PM MRN: [id number] Endoscopist: Iva Boop , MD, [id number] Age: 62 Referring MD:  Date of Birth: 11/21/1960 Gender: Female Account #: [id number] Procedure:                Colonoscopy Indications:              Iron deficiency anemia Medicines:                Monitored Anesthesia Care Procedure:                Pre-Anesthesia Assessment:                           - Prior to the procedure, a History and Physical                            was performed, and patient medications and                            allergies were reviewed. The patient's tolerance of                            previous anesthesia was also reviewed. The risks                            and benefits of the procedure and the sedation                            options and risks were discussed with the patient.                            All questions were answered, and informed consent                            was obtained. Prior Anticoagulants: The patient has                            taken no anticoagulant or antiplatelet agents. ASA                            Grade Assessment: III - A patient with severe                            systemic disease. After reviewing the risks and                            benefits, the patient was deemed in satisfactory                            condition to undergo the procedure.                           After obtaining informed consent, the colonoscope  was passed under direct vision. Throughout the                            procedure, the patient's blood pressure, pulse, and                            oxygen saturations were monitored continuously. The                            CF [id number] #[id number] was introduced through the anus                            and advanced to the the cecum, identified by                            appendiceal orifice and ileocecal  valve. The                            colonoscopy was performed without difficulty. The                            patient tolerated the procedure well. The quality                            of the bowel preparation was adequate. The                            ileocecal valve and the rectum were photographed. I                            had thought I photographed appendiceal orifice -                            photo either lost or not done. Appendiceal orifice                            was identified. The bowel preparation used was                            SUPREP via split dose instruction. Scope In: 3:01:56 PM Scope Out: 3:15:03 PM Scope Withdrawal Time: 0 hours 9 minutes 48 seconds  Total Procedure Duration: 0 hours 13 minutes 7 seconds  Findings:                 The perianal and digital rectal examinations were                            normal.                           Internal hemorrhoids were found.                           The exam was otherwise without abnormality on  direct and retroflexion views. Complications:            No immediate complications. Estimated Blood Loss:     Estimated blood loss: none. Impression:               - Internal hemorrhoids.                           - The examination was otherwise normal on direct                            and retroflexion views.                           - No specimens collected. Recommendation:           - Patient has a contact number available for                            emergencies. The signs and symptoms of potential                            delayed complications were discussed with the                            patient. Return to normal activities tomorrow.                            Written discharge instructions were provided to the                            patient.                           - Resume previous diet.                           - Continue present medications.                            - Repeat colonoscopy in 10 years for screening                            purposes.                           - See the other procedure note for documentation of                            additional recommendations. Iva Boop, MD 06/30/2022 3:25:35 PM This report has been signed electronically.

## 2022-06-30 NOTE — Progress Notes (Signed)
Pt's states no medical or surgical changes since previsit or office visit. 

## 2022-06-30 NOTE — Progress Notes (Signed)
History and Physical Interval Note:  06/30/2022 2:47 PM  Jessica Collier  has presented today for endoscopic procedure(s), with the diagnosis of  Encounter Diagnoses  Name Primary?   Abdominal pain, epigastric Yes   Iron deficiency anemia, unspecified iron deficiency anemia type   .  The various methods of evaluation and treatment have been discussed with the patient and/or family. After consideration of risks, benefits and other options for treatment, the patient has consented to  the endoscopic procedure(s).   The patient's history has been reviewed, patient examined, no change in status, stable for endoscopic procedure(s).  I have reviewed the patient's chart and labs.  Questions were answered to the patient's satisfaction.     Iva Boop, MD, [facility]

## 2022-06-30 NOTE — Progress Notes (Signed)
Called to room to assist during endoscopic procedure.  Patient ID and intended procedure confirmed with present staff. Received instructions for my participation in the procedure from the performing physician.  

## 2022-06-30 NOTE — Progress Notes (Signed)
Sedate, gd SR, tolerated procedure well, VSS, report to RN 

## 2022-06-30 NOTE — Patient Instructions (Addendum)
The stomach showed some signs of inflammation and there were benign-appearing polyps. Biopsies taken. The stomach was also somewhat deformed - raises ? Of ulcer in the past but may just be your stomach's shape?  Colonoscopy ok - no polyps, cancer or other source of anemia. Hemorrhoids were a bit swollen which is common with colonoscopy prepaaration.  I will let you know results and plans.  You should continue to refrain from donating blood.  I appreciate the opportunity to care for you. Iva Boop, MD, FACG  YOU HAD AN ENDOSCOPIC PROCEDURE TODAY AT THE [facility]:   Refer to the procedure report that was given to you for any specific questions about what was found during the examination.  If the procedure report does not answer your questions, please call your gastroenterologist to clarify.  If you requested that your care partner not be given the details of your procedure findings, then the procedure report has been included in a sealed envelope for you to review at your convenience later.  YOU SHOULD EXPECT: Some feelings of bloating in the abdomen. Passage of more gas than usual.  Walking can help get rid of the air that was put into your GI tract during the procedure and reduce the bloating. If you had a lower endoscopy (such as a colonoscopy or flexible sigmoidoscopy) you may notice spotting of blood in your stool or on the toilet paper. If you underwent a bowel prep for your procedure, you may not have a normal bowel movement for a few days.  Please Note:  You might notice some irritation and congestion in your nose or some drainage.  This is from the oxygen used during your procedure.  There is no need for concern and it should clear up in a day or so.  SYMPTOMS TO REPORT IMMEDIATELY:  Following lower endoscopy (colonoscopy or flexible sigmoidoscopy):  Excessive amounts of blood in the stool  Significant tenderness or worsening of abdominal pains  Swelling of the  abdomen that is new, acute  Fever of 100F or higher  Following upper endoscopy (EGD)  Vomiting of blood or coffee ground material  New chest pain or pain under the shoulder blades  Painful or persistently difficult swallowing  New shortness of breath  Fever of 100F or higher  Black, tarry-looking stools  For urgent or emergent issues, a gastroenterologist can be reached at any hour by calling [phone]. Do not use MyChart messaging for urgent concerns.    DIET:  We do recommend a small meal at first, but then you may proceed to your regular diet.  Drink plenty of fluids but you should avoid alcoholic beverages for 24 hours.  ACTIVITY:  You should plan to take it easy for the rest of today and you should NOT DRIVE or use heavy machinery until tomorrow (because of the sedation medicines used during the test).    FOLLOW UP: Our staff will call the number listed on your records the next business day following your procedure.  We will call around 7:15- 8:00 am to check on you and address any questions or concerns that you may have regarding the information given to you following your procedure. If we do not reach you, we will leave a message.     If any biopsies were taken you will be contacted by phone or by letter within the next 1-3 weeks.  Please call us at [phone] if you have not heard about the biopsies in 3 weeks.  SIGNATURES/CONFIDENTIALITY: You and/or your care partner have signed paperwork which will be entered into your electronic medical record.  These signatures attest to the fact that that the information above on your After Visit Summary has been reviewed and is understood.  Full responsibility of the confidentiality of this discharge information lies with you and/or your care-partner.

## 2022-07-01 ENCOUNTER — Telehealth: Payer: Self-pay | Admitting: *Deleted

## 2022-07-01 NOTE — Telephone Encounter (Signed)
Left message on f/u call 

## 2022-07-27 ENCOUNTER — Ambulatory Visit (INDEPENDENT_AMBULATORY_CARE_PROVIDER_SITE_OTHER): Payer: BC Managed Care – PPO

## 2022-07-27 DIAGNOSIS — I469 Cardiac arrest, cause unspecified: Secondary | ICD-10-CM

## 2022-07-28 LAB — CUP PACEART REMOTE DEVICE CHECK
Battery Remaining Percentage: 84 %
Date Time Interrogation Session: 20240716102700
Implantable Lead Connection Status: 753985
Implantable Lead Implant Date: 20160722
Implantable Lead Location: 753862
Implantable Lead Model: 3401
Implantable Pulse Generator Implant Date: 20230109
Pulse Gen Serial Number: 170756

## 2022-08-02 ENCOUNTER — Other Ambulatory Visit: Payer: Self-pay | Admitting: Internal Medicine

## 2022-08-07 NOTE — Progress Notes (Signed)
Remote ICD transmission.   

## 2022-08-14 ENCOUNTER — Ambulatory Visit (HOSPITAL_COMMUNITY)
Admission: RE | Admit: 2022-08-14 | Discharge: 2022-08-14 | Disposition: A | Discharge status: Home / Self Care | Payer: BC Managed Care – PPO | Source: Ambulatory Visit | Attending: Internal Medicine | Admitting: Internal Medicine

## 2022-08-14 DIAGNOSIS — R932 Abnormal findings on diagnostic imaging of liver and biliary tract: Secondary | ICD-10-CM | POA: Insufficient documentation

## 2022-08-14 DIAGNOSIS — K769 Liver disease, unspecified: Secondary | ICD-10-CM | POA: Diagnosis present

## 2022-08-14 MED ORDER — GADOBUTROL 1 MMOL/ML IV SOLN
7.0000 mL | Freq: Once | INTRAVENOUS | Status: AC | PRN
  Administered 2022-08-14: 7 mL via INTRAVENOUS

## 2022-08-19 ENCOUNTER — Encounter: Payer: Self-pay | Admitting: Internal Medicine

## 2022-10-08 ENCOUNTER — Other Ambulatory Visit: Payer: Self-pay | Admitting: Obstetrics and Gynecology

## 2022-10-08 DIAGNOSIS — Z1231 Encounter for screening mammogram for malignant neoplasm of breast: Secondary | ICD-10-CM

## 2022-10-26 ENCOUNTER — Ambulatory Visit (INDEPENDENT_AMBULATORY_CARE_PROVIDER_SITE_OTHER): Payer: BC Managed Care – PPO

## 2022-10-26 DIAGNOSIS — I4901 Ventricular fibrillation: Secondary | ICD-10-CM

## 2022-10-26 DIAGNOSIS — I469 Cardiac arrest, cause unspecified: Secondary | ICD-10-CM | POA: Diagnosis not present

## 2022-10-28 ENCOUNTER — Telehealth: Payer: Self-pay | Admitting: *Deleted

## 2022-10-28 LAB — CUP PACEART REMOTE DEVICE CHECK
Battery Remaining Percentage: 81 %
Date Time Interrogation Session: 20241015183200
HighPow Impedance: 80 Ohm
Implantable Lead Connection Status: 753985
Implantable Lead Implant Date: 20160722
Implantable Lead Location: 753862
Implantable Lead Model: 3401
Implantable Pulse Generator Implant Date: 20230109
Pulse Gen Serial Number: 170756

## 2022-10-28 NOTE — Telephone Encounter (Signed)
Returning Jessica Collier's telephone message regarding continued left leg swelling and request for treatment options.  Jessica Collier is s/p endovenous laser ablation Left greater saphenous vein on 01-08-2022 by Coral Else MD. Jessica Collier states she wears her thigh high compression hose at times but not consistently. She also states she is not currently seeing the lymphedema therapist but she  occasionally does PT exercises that were given to her by a lymphedema physical therapist.  Jessica Collier describes her swelling as being from the knee down including swelling on the back of her ankle extending down to the base of her foot and heel.  I will reach out to Dr. Myra Gianotti regarding re-evaluation of LLE swelling and treatment plan options and then follow up with Jessica Collier by phone.

## 2022-11-03 ENCOUNTER — Encounter: Payer: Self-pay | Admitting: *Deleted

## 2022-11-03 ENCOUNTER — Ambulatory Visit
Admission: RE | Admit: 2022-11-03 | Discharge: 2022-11-03 | Disposition: A | Discharge status: Home / Self Care | Payer: BC Managed Care – PPO | Source: Ambulatory Visit | Attending: Obstetrics and Gynecology | Admitting: Obstetrics and Gynecology

## 2022-11-03 DIAGNOSIS — Z1231 Encounter for screening mammogram for malignant neoplasm of breast: Secondary | ICD-10-CM

## 2022-11-05 ENCOUNTER — Ambulatory Visit: Payer: BC Managed Care – PPO

## 2022-11-06 ENCOUNTER — Other Ambulatory Visit: Payer: Self-pay

## 2022-11-06 DIAGNOSIS — I83892 Varicose veins of left lower extremities with other complications: Secondary | ICD-10-CM

## 2022-11-09 NOTE — Progress Notes (Signed)
Remote ICD transmission.   

## 2022-11-20 ENCOUNTER — Ambulatory Visit (HOSPITAL_COMMUNITY)
Admission: RE | Admit: 2022-11-20 | Discharge: 2022-11-20 | Disposition: A | Discharge status: Home / Self Care | Payer: BC Managed Care – PPO | Source: Ambulatory Visit | Attending: Surgery | Admitting: Surgery

## 2022-11-20 DIAGNOSIS — I83892 Varicose veins of left lower extremities with other complications: Secondary | ICD-10-CM | POA: Insufficient documentation

## 2022-11-20 MED ORDER — IOHEXOL 350 MG/ML SOLN
125.0000 mL | Freq: Once | INTRAVENOUS | Status: AC | PRN
  Administered 2022-11-20: 125 mL via INTRAVENOUS

## 2022-11-30 ENCOUNTER — Ambulatory Visit: Payer: BC Managed Care – PPO | Admitting: Surgery

## 2022-11-30 ENCOUNTER — Encounter: Payer: Self-pay | Admitting: Surgery

## 2022-11-30 VITALS — BP 115/69 | HR 79 | Temp 97.7°F | Resp 20 | Ht 70.0 in | Wt 151.0 lb

## 2022-11-30 DIAGNOSIS — I83892 Varicose veins of left lower extremities with other complications: Secondary | ICD-10-CM | POA: Diagnosis not present

## 2022-11-30 NOTE — Progress Notes (Signed)
[facility] [place]  Patient name: Jessica Collier MRN: [id number] DOB: 1960-07-14 Sex: female   REASON FOR VISIT:    Follow up  HISOTRY OF PRESENT ILLNESS:    TIARE DEEGAN is a 62 y.o. 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  She has undergone ICD implantation for prevention.  She was seen by Dr. Graciela Husbands recently and she was complaining of left leg swelling since February 2023.  She has had venous Dopplers that were unremarkable.  Her BNP was normal.  She took Lasix that did not help.  She has worn compression socks but the fluid builds up around her knee.  She got a CT scan to look for May Thurner syndrome however the aortic bifurcation was not visualized on the scan.  I therefore sent her to get a formal reflux evaluation which showed significant saphenous vein reflux.  She has already been working with the lymphedema therapist.  She was fitted for thigh-high compression stocks as well.  On 01/08/2022, she underwent laser ablation of the left saphenous vein.  She continues to have left leg swelling.  She has been doing exercises that were given to her by a physical therapist.  She will occasionally wear her compression socks.  They do help.   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   Hepatic cysts    Inappropriate shocks from ICD (implantable cardioverter-defibrillator) 03/05/2015   PONV (postoperative nausea and vomiting)    ST  elevation myocardial infarction (STEMI) of inferior wall, due to SPASM 07/31/2014   July/2016 thought due to coronary spasm-normal coronary arteries noted.  Cardiac MRI confirms area of infarction in the territory of the right coronary artery.      FAMILY HISTORY:   Family History  Problem Relation Age of Onset   Hypertension Father    Stomach cancer Maternal Aunt    Stomach cancer Maternal Uncle    Esophageal cancer Neg Hx    Colon cancer Neg Hx    Rectal cancer Neg Hx     SOCIAL HISTORY:   Social History   Tobacco Use   Smoking status: Never   Smokeless tobacco: Never  Substance Use Topics   Alcohol use: No     ALLERGIES:   No Known Allergies   CURRENT MEDICATIONS:   Current Outpatient Medications  Medication Sig Dispense Refill   Cholecalciferol (VITAMIN D3 PO) Take 1 tablet by mouth daily.     diltiazem (CARDIZEM CD) 120 MG 24 hr capsule TAKE 1 CAPSULE BY MOUTH EVERY DAY 90 capsule 3   isosorbide mononitrate (IMDUR) 30 MG 24 hr tablet TAKE 1 TABLET BY MOUTH EVERY DAY 90 tablet 3   nitroGLYCERIN (NITROSTAT) 0.4 MG SL tablet Place 1 tablet (0.4 mg total) under the tongue every 5 (five) minutes as needed for chest pain. 25 tablet 3   zolpidem (AMBIEN) 10 MG tablet 10 mg at bedtime as needed.     Current Facility-Administered Medications  Medication Dose Route Frequency Provider Last Rate Last Admin   0.9 %  sodium chloride infusion  500 mL Intravenous Once Iva Boop, MD        REVIEW OF SYSTEMS:   [X]  denotes positive finding, [ ]  denotes negative finding Cardiac  Comments:  Chest pain or chest pressure:    Shortness of breath upon exertion:    Short of breath when lying flat:    Irregular heart rhythm:        Vascular    Pain in calf, thigh, or hip brought on by ambulation:    Pain in feet at night that wakes you up from your sleep:     Blood clot in your veins:    Leg swelling:  x       Pulmonary    Oxygen at home:    Productive cough:      Wheezing:         Neurologic    Sudden weakness in arms or legs:     Sudden numbness in arms or legs:     Sudden onset of difficulty speaking or slurred speech:    Temporary loss of vision in one eye:     Problems with dizziness:         Gastrointestinal    Blood in stool:     Vomited blood:         Genitourinary    Burning when urinating:     Blood in urine:        Psychiatric    Major depression:         Hematologic    Bleeding problems:    Problems with blood clotting too easily:        Skin    Rashes or ulcers:        Constitutional    Fever or chills:      PHYSICAL EXAM:   Vitals:   11/30/22 1418  BP: 115/69  Pulse: 79  Resp: 20  Temp: 97.7 F (36.5 C)  SpO2: 97%  Weight: 151 lb (68.5 kg)  Height: 5\' 10"  (1.778 m)    GENERAL: The patient is a well-nourished female, in no acute distress. The vital signs are documented above. CARDIAC: There is a regular rate and rhythm.  VASCULAR: Left leg nonpitting edema PULMONARY: Non-labored respirations  MUSCULOSKELETAL: There are no major deformities or cyanosis. NEUROLOGIC: No focal weakness or paresthesias are detected. SKIN: There are no ulcers or rashes noted. PSYCHIATRIC: The patient has a normal affect.  STUDIES:   I have reviewed the following CT venogram : 1. No evidence of mechanical iliac venous compression. 2. No evidence of caval, pelvic or lower extremity DVT. 3. Duplicated popliteal veins bilaterally. 4. Surgical changes of prior cholecystectomy. 5. Multiple benign-appearing hepatic cysts. No imaging follow-up is recommended.   MEDICAL ISSUES:   Left leg swelling: The patient was initially found to have unilateral left leg swelling.  Ultrasound showed superficial venous insufficiency in the saphenous vein from the knee to the groin.  She underwent successful endovenous laser ablation which was confirmed by postoperative ultrasound.  She has never had a DVT.  She had persistent edema which  led to a CT venogram to rule out May Thurner syndrome.  This was unremarkable.  There is clearly no evidence of iliac vein compression.  Therefore I discussed with the patient that this most likely has lymphedema as I cannot find a venous etiology.  I am making a referral to the lymphedema clinic.  Hopefully she would be able to be approved for lymphedema pumps.  I have encouraged her to wear her compression stocks is much as possible and to continue with routine exercise.  She will follow-up with me on an as-needed basis    Charlena Cross, MD, FACS [facility] [phone] Pager [phone]

## 2023-03-10 ENCOUNTER — Telehealth: Payer: Self-pay | Admitting: Internal Medicine

## 2023-03-10 NOTE — Telephone Encounter (Signed)
 Patient is following-up to confirm her device transmission was received.

## 2023-03-11 ENCOUNTER — Telehealth: Payer: Self-pay

## 2023-03-11 NOTE — Telephone Encounter (Signed)
 Appt/Referral:  -pt called in reference to referral sent to a lymphedema clinic w/o any call back -returned call and info given on clinic on [address].  Pt will call and confirm she can get in and will call back to let us know where to send referral to

## 2023-03-11 NOTE — Telephone Encounter (Signed)
 Transmission received lvm

## 2023-04-26 ENCOUNTER — Ambulatory Visit (INDEPENDENT_AMBULATORY_CARE_PROVIDER_SITE_OTHER): Payer: BC Managed Care – PPO

## 2023-04-26 DIAGNOSIS — I469 Cardiac arrest, cause unspecified: Secondary | ICD-10-CM

## 2023-04-27 LAB — CUP PACEART REMOTE DEVICE CHECK
Battery Remaining Percentage: 76 %
Date Time Interrogation Session: 20250414190000
HighPow Impedance: 85 Ohm
Implantable Lead Connection Status: 753985
Implantable Lead Implant Date: 20160722
Implantable Lead Location: 753862
Implantable Lead Model: 3401
Implantable Pulse Generator Implant Date: 20230109
Pulse Gen Serial Number: 170756

## 2023-04-28 ENCOUNTER — Other Ambulatory Visit: Payer: Self-pay

## 2023-04-28 MED ORDER — DILTIAZEM HCL ER COATED BEADS 120 MG PO CP24: ORAL_CAPSULE | ORAL | 0 refills | Status: DC

## 2023-05-06 ENCOUNTER — Encounter: Payer: Self-pay | Admitting: Internal Medicine

## 2023-05-21 ENCOUNTER — Ambulatory Visit: Admitting: Internal Medicine

## 2023-06-10 NOTE — Progress Notes (Signed)
 Remote ICD transmission.

## 2023-06-10 NOTE — Addendum Note (Signed)
 Addended by: Edra Govern D on: 06/10/2023 02:05 PM   Modules accepted: Orders

## 2023-07-13 ENCOUNTER — Encounter: Admitting: Internal Medicine

## 2023-07-19 ENCOUNTER — Encounter: Payer: Self-pay | Admitting: Cardiovascular Disease

## 2023-07-19 ENCOUNTER — Ambulatory Visit: Attending: Cardiovascular Disease | Admitting: Cardiovascular Disease

## 2023-07-19 VITALS — BP 108/70 | HR 72 | Ht 70.0 in | Wt 143.5 lb

## 2023-07-19 DIAGNOSIS — I4901 Ventricular fibrillation: Secondary | ICD-10-CM

## 2023-07-19 LAB — CUP PACEART INCLINIC DEVICE CHECK
Date Time Interrogation Session: 20250707120914
Implantable Lead Connection Status: 753985
Implantable Lead Implant Date: 20160722
Implantable Lead Location: 753862
Implantable Lead Model: 3401
Implantable Pulse Generator Implant Date: 20230109
Pulse Gen Serial Number: 170756

## 2023-07-19 MED ORDER — DILTIAZEM HCL ER COATED BEADS 120 MG PO CP24: 120.0000 mg | ORAL_CAPSULE | Freq: Every day | ORAL | 3 refills | Status: AC

## 2023-07-19 MED ORDER — ISOSORBIDE MONONITRATE ER 30 MG PO TB24
30.0000 mg | ORAL_TABLET | Freq: Every day | ORAL | 3 refills | Status: AC
Start: 2023-07-19 — End: ?

## 2023-07-19 MED ORDER — NITROGLYCERIN 0.4 MG SL SUBL: 0.4000 mg | SUBLINGUAL_TABLET | SUBLINGUAL | 3 refills | Status: AC | PRN

## 2023-07-19 NOTE — Progress Notes (Signed)
  Electrophysiology Office Note:    Date:  07/19/2023   ID:  Jessica Collier, DOB 11-Jan-1961, MRN [id number]  PCP:  Latisha Ronal Crank, PA-C   [facility] Providers Cardiologist:  None Electrophysiologist:  Elspeth Sage, MD     Referring MD: Latisha Ronal Crank,*   History of Present Illness:    Jessica Collier is a 63 y.o. female with a medical history significant for cardiac arrest who presents for device follow-up.     She is had a prior cardiac arrest in the setting of inferior wall spasm.  She underwent ICD implant for secondary prevention; she underwent generator change in January 2023.  Shortly after the initial device placement she had inappropriate shocks for T wave oversensing but this has not been an issue since the device has been reprogrammed.   she has no device related complaints -- no new tenderness, drainage, redness.    History of Present Illness          Today, she reports that she is doing well and has no complaints  EKGs/Labs/Other Studies Reviewed Today:     Echocardiogram:  TEE May 2023 LV function normal, EF 50 to 55%.  Normal structure and function.  EKG:   EKG Interpretation Date/Time:  Monday July 19 2023 11:42:40 EDT Ventricular Rate:  72 PR Interval:  168 QRS Duration:  76 QT Interval:  390 QTC Calculation: 427 R Axis:   -4  Text Interpretation: Normal sinus rhythm Possible Left atrial enlargement When compared with ECG of 14-Jun-2018 09:08, No significant change was found Confirmed by Nancey Scotts [phone]) on 07/19/2023 12:05:36 PM     Physical Exam:    VS:  BP 108/70   Pulse 72   Ht 5' 10 (1.778 m)   Wt 143 lb 8 oz (65.1 kg)   SpO2 96%   BMI 20.59 kg/m     Wt Readings from Last 3 Encounters:  07/19/23 143 lb 8 oz (65.1 kg)  11/30/22 151 lb (68.5 kg)  06/30/22 147 lb (66.7 kg)     GEN: Well nourished, well developed in no acute distress CARDIAC: RRR, no murmurs, rubs, gallops RESPIRATORY:  Normal  work of breathing MUSCULOSKELETAL: no edema    ASSESSMENT & PLAN:     Coronary vasospasm, cardiac arrest Subcutaneous ICD in place, functioning normally Will continue to prescribe Imdur  and diltiazem   [employer] subcutaneous ICD I reviewed today's interrogation Device is functioning normally  This is my first time meeting Jessica Collier.  I spent 32 minutes in chart review, examining and interviewing the patient and documentation.   Signed, Scotts FORBES Nancey, MD  07/19/2023 5:39 PM    [facility]

## 2023-07-19 NOTE — Patient Instructions (Signed)
 Medication Instructions:  Your physician recommends that you continue on your current medications as directed. Please refer to the Current Medication list given to you today.  *If you need a refill on your cardiac medications before your next appointment, please call your pharmacy*  Lab Work: None ordered.  If you have labs (blood work) drawn today and your tests are completely normal, you will receive your results only by: MyChart Message (if you have MyChart) OR A paper copy in the mail If you have any lab test that is abnormal or we need to change your treatment, we will call you to review the results.  Testing/Procedures: None ordered.   Follow-Up: At [facility], you and your health needs are our priority.  As part of our continuing mission to provide you with exceptional heart care, our providers are all part of one team.  This team includes your primary Cardiologist (physician) and Advanced Practice Providers or APPs (Physician Assistants and Nurse Practitioners) who all work together to provide you with the care you need, when you need it.  Your next appointment:   12 months with Dr Katheryne Pane PA

## 2023-07-26 ENCOUNTER — Ambulatory Visit

## 2023-07-26 DIAGNOSIS — I469 Cardiac arrest, cause unspecified: Secondary | ICD-10-CM

## 2023-07-26 LAB — CUP PACEART REMOTE DEVICE CHECK
Battery Remaining Percentage: 73 %
Date Time Interrogation Session: 20250714103200
HighPow Impedance: 75 Ohm
Implantable Lead Connection Status: 753985
Implantable Lead Implant Date: 20160722
Implantable Lead Location: 753862
Implantable Lead Model: 3401
Implantable Pulse Generator Implant Date: 20230109
Pulse Gen Serial Number: 170756

## 2023-07-28 ENCOUNTER — Ambulatory Visit: Payer: Self-pay | Admitting: Cardiovascular Disease

## 2023-10-21 NOTE — Progress Notes (Signed)
 Remote ICD Transmission

## 2023-10-25 ENCOUNTER — Ambulatory Visit

## 2023-10-25 DIAGNOSIS — I4901 Ventricular fibrillation: Secondary | ICD-10-CM | POA: Diagnosis not present

## 2023-10-27 LAB — CUP PACEART REMOTE DEVICE CHECK
Battery Remaining Percentage: 70 %
Date Time Interrogation Session: 20251014211100
HighPow Impedance: 85 Ohm
Implantable Lead Connection Status: 753985
Implantable Lead Implant Date: 20160722
Implantable Lead Location: 753862
Implantable Lead Model: 3401
Implantable Pulse Generator Implant Date: 20230109
Pulse Gen Serial Number: 170756

## 2023-10-28 NOTE — Progress Notes (Signed)
 Remote ICD Transmission

## 2023-11-08 ENCOUNTER — Ambulatory Visit: Payer: Self-pay | Admitting: Cardiovascular Disease

## 2024-01-24 ENCOUNTER — Ambulatory Visit

## 2024-01-24 DIAGNOSIS — I469 Cardiac arrest, cause unspecified: Secondary | ICD-10-CM

## 2024-01-25 ENCOUNTER — Ambulatory Visit: Payer: Self-pay | Admitting: Cardiovascular Disease

## 2024-01-25 LAB — CUP PACEART REMOTE DEVICE CHECK
Battery Remaining Percentage: 67 %
Battery Voltage: 67
Date Time Interrogation Session: 20260112131000
HighPow Impedance: 85 Ohm
Implantable Lead Connection Status: 753985
Implantable Lead Implant Date: 20160722
Implantable Lead Location: 753862
Implantable Lead Model: 3401
Implantable Pulse Generator Implant Date: 20230109
Pulse Gen Serial Number: 170756

## 2024-01-26 NOTE — Progress Notes (Signed)
 Remote ICD Transmission

## 2024-04-24 ENCOUNTER — Encounter

## 2024-07-24 ENCOUNTER — Encounter

## 2024-10-23 ENCOUNTER — Encounter

## 2025-01-22 ENCOUNTER — Encounter

## 2025-04-23 ENCOUNTER — Encounter
